# Patient Record
Sex: Female | Born: 1975 | Race: White | Hispanic: No | Marital: Single | Smoking: Current every day smoker
Health system: Southern US, Community
[De-identification: ages and names within clinical notes are randomized; demographics above are authoritative.]

## PROBLEM LIST (undated history)

## (undated) DIAGNOSIS — G4700 Insomnia, unspecified: Secondary | ICD-10-CM

## (undated) DIAGNOSIS — G43909 Migraine, unspecified, not intractable, without status migrainosus: Secondary | ICD-10-CM

## (undated) DIAGNOSIS — M779 Enthesopathy, unspecified: Secondary | ICD-10-CM

## (undated) DIAGNOSIS — M5136 Other intervertebral disc degeneration, lumbar region: Secondary | ICD-10-CM

## (undated) DIAGNOSIS — J329 Chronic sinusitis, unspecified: Secondary | ICD-10-CM

## (undated) DIAGNOSIS — M25569 Pain in unspecified knee: Secondary | ICD-10-CM

## (undated) DIAGNOSIS — M797 Fibromyalgia: Secondary | ICD-10-CM

## (undated) DIAGNOSIS — T148XXA Other injury of unspecified body region, initial encounter: Secondary | ICD-10-CM

## (undated) DIAGNOSIS — K219 Gastro-esophageal reflux disease without esophagitis: Secondary | ICD-10-CM

## (undated) DIAGNOSIS — M51369 Other intervertebral disc degeneration, lumbar region without mention of lumbar back pain or lower extremity pain: Secondary | ICD-10-CM

## (undated) DIAGNOSIS — E663 Overweight: Secondary | ICD-10-CM

## (undated) DIAGNOSIS — G629 Polyneuropathy, unspecified: Secondary | ICD-10-CM

## (undated) DIAGNOSIS — I10 Essential (primary) hypertension: Secondary | ICD-10-CM

## (undated) HISTORY — DX: Enthesopathy, unspecified: M77.9

## (undated) HISTORY — DX: Insomnia, unspecified: G47.00

## (undated) HISTORY — DX: Essential (primary) hypertension: I10

## (undated) HISTORY — DX: Other intervertebral disc degeneration, lumbar region: M51.36

## (undated) HISTORY — DX: Gastro-esophageal reflux disease without esophagitis: K21.9

## (undated) HISTORY — DX: Overweight: E66.3

## (undated) HISTORY — DX: Other injury of unspecified body region, initial encounter: T14.8XXA

## (undated) HISTORY — PX: MOUTH SURGERY: SHX715

## (undated) HISTORY — DX: Polyneuropathy, unspecified: G62.9

## (undated) HISTORY — DX: Chronic sinusitis, unspecified: J32.9

## (undated) HISTORY — DX: Pain in unspecified knee: M25.569

## (undated) HISTORY — DX: Migraine, unspecified, not intractable, without status migrainosus: G43.909

## (undated) HISTORY — DX: Other intervertebral disc degeneration, lumbar region without mention of lumbar back pain or lower extremity pain: M51.369

## (undated) HISTORY — DX: Fibromyalgia: M79.7

---

## 2014-04-02 ENCOUNTER — Encounter: Payer: Self-pay | Admitting: *Deleted

## 2014-04-02 ENCOUNTER — Ambulatory Visit (INDEPENDENT_AMBULATORY_CARE_PROVIDER_SITE_OTHER): Payer: Medicare Other | Admitting: *Deleted

## 2014-04-02 ENCOUNTER — Ambulatory Visit (INDEPENDENT_AMBULATORY_CARE_PROVIDER_SITE_OTHER): Payer: Medicare Other | Admitting: Women's Health

## 2014-04-02 ENCOUNTER — Encounter: Payer: Self-pay | Admitting: Women's Health

## 2014-04-02 VITALS — BP 120/76 | Ht 67.0 in | Wt 243.0 lb

## 2014-04-02 DIAGNOSIS — Z30013 Encounter for initial prescription of injectable contraceptive: Secondary | ICD-10-CM

## 2014-04-02 DIAGNOSIS — J0100 Acute maxillary sinusitis, unspecified: Secondary | ICD-10-CM | POA: Diagnosis not present

## 2014-04-02 DIAGNOSIS — Z3042 Encounter for surveillance of injectable contraceptive: Secondary | ICD-10-CM | POA: Diagnosis not present

## 2014-04-02 DIAGNOSIS — Z3202 Encounter for pregnancy test, result negative: Secondary | ICD-10-CM

## 2014-04-02 LAB — POCT URINE PREGNANCY: PREG TEST UR: NEGATIVE

## 2014-04-02 MED ORDER — MEDROXYPROGESTERONE ACETATE 150 MG/ML IM SUSP: 150.0000 mg | INTRAMUSCULAR | Status: DC

## 2014-04-02 MED ORDER — AMOXICILLIN-POT CLAVULANATE 875-125 MG PO TABS: 1.0000 | ORAL_TABLET | Freq: Two times a day (BID) | ORAL | Status: DC

## 2014-04-02 MED ORDER — MEDROXYPROGESTERONE ACETATE 150 MG/ML IM SUSP
150.0000 mg | Freq: Once | INTRAMUSCULAR | Status: AC
  Administered 2014-04-02: 150 mg via INTRAMUSCULAR

## 2014-04-02 NOTE — Progress Notes (Signed)
Patient ID: Brooke [name], female   DOB: 1975-08-14, 39 y.o.   MRN: [id number]   [facility] Visit  Patient name: Brooke [name] MRN [id number]  Date of birth: Aug 29, 1975  CC & HPI:  Brooke [name] is a 39 y.o. Caucasian female presenting today as a new pt for continuation of depo provera injections. She recently moved here from [place]. Last depo 01/02/14. Has been on x 38yrs, no complications.  Has started process to have Dr. Karie Kirks as PCP, but states she was told she may not be able to see him for first appt til April-May.  Reports Lt sinus pain and swelling x 2d, no fever/chills, or other sx.  Smokes 1.5ppd down from 3ppd. Last pap 2015 in [place] and was normal.   Pertinent History Reviewed:  Medical & Surgical Hx:   Past Medical History  Diagnosis Date  . GERD (gastroesophageal reflux disease)   . Hypertension   . Fibromyalgia   . Neuropathy   . Migraines    History reviewed. No pertinent past surgical history. Medications: Reviewed & Updated - see associated section Social History: Reviewed -  reports that she has been smoking Cigarettes.  She has a 34.5 pack-year smoking history. She has never used smokeless tobacco.  Objective Findings:  Vitals: BP 120/76 mmHg  Ht 5\' 7"  (1.702 m)  Wt 243 lb (110.224 kg)  BMI 38.05 kg/m2  LMP 04/01/2014  Physical Examination: General appearance - alert, well appearing, and in no distress Sinuses: +swelling and mod tenderness/pain to Lt maxillary sinus Neck: no lymphadenopathy Oropharynx: clear w/o exudate Chest - clear to auscultation, no wheezes, rales, symmetric air entry, some rhonchi Heart - normal rate and regular rhythm  No results found for this or any previous visit (from the past 24 hour(s)).   Assessment & Plan:  A:   Contraception management  Acute sinusitis  Smoker P:  Rx depo w/ 3RF, to return today for injection  Rx augmentin 875/125 bid x 7d for sinus infection/smoker   Advised smoking  cessation  Contact Dr. Karie Kirks to establish care/schedule first appt  Tawnya Crook CNM, [facility] 04/02/2014 10:43 AM

## 2014-04-02 NOTE — Progress Notes (Signed)
Pt here for Depo. Reports no problems at this time. Return in 12 weeks for next shot. JSY 

## 2014-04-02 NOTE — Patient Instructions (Signed)

## 2014-06-26 ENCOUNTER — Encounter: Payer: Self-pay | Admitting: *Deleted

## 2014-06-26 ENCOUNTER — Ambulatory Visit (INDEPENDENT_AMBULATORY_CARE_PROVIDER_SITE_OTHER): Payer: Medicare Other | Admitting: *Deleted

## 2014-06-26 DIAGNOSIS — Z3202 Encounter for pregnancy test, result negative: Secondary | ICD-10-CM

## 2014-06-26 DIAGNOSIS — Z3042 Encounter for surveillance of injectable contraceptive: Secondary | ICD-10-CM | POA: Diagnosis not present

## 2014-06-26 LAB — POCT URINE PREGNANCY: PREG TEST UR: NEGATIVE

## 2014-06-26 MED ORDER — MEDROXYPROGESTERONE ACETATE 150 MG/ML IM SUSP
150.0000 mg | Freq: Once | INTRAMUSCULAR | Status: AC
  Administered 2014-06-26: 150 mg via INTRAMUSCULAR

## 2014-06-26 NOTE — Progress Notes (Signed)
Pt here for Depo. Reports no problems at this time. Return in 12 weeks for next shot. JSY 

## 2014-09-18 ENCOUNTER — Encounter: Payer: Self-pay | Admitting: *Deleted

## 2014-09-18 ENCOUNTER — Ambulatory Visit (INDEPENDENT_AMBULATORY_CARE_PROVIDER_SITE_OTHER): Payer: Medicare Other | Admitting: *Deleted

## 2014-09-18 DIAGNOSIS — Z3202 Encounter for pregnancy test, result negative: Secondary | ICD-10-CM | POA: Diagnosis not present

## 2014-09-18 DIAGNOSIS — Z3042 Encounter for surveillance of injectable contraceptive: Secondary | ICD-10-CM | POA: Diagnosis not present

## 2014-09-18 LAB — POCT URINE PREGNANCY: PREG TEST UR: NEGATIVE

## 2014-09-18 MED ORDER — MEDROXYPROGESTERONE ACETATE 150 MG/ML IM SUSP
150.0000 mg | Freq: Once | INTRAMUSCULAR | Status: AC
  Administered 2014-09-18: 150 mg via INTRAMUSCULAR

## 2014-09-18 NOTE — Progress Notes (Signed)
Pt here for Depo. Reports no problems at this time. Return in 12 weeks for next shot. JSY 

## 2014-11-04 ENCOUNTER — Encounter (HOSPITAL_COMMUNITY): Payer: Self-pay | Admitting: Emergency Medicine

## 2014-11-04 ENCOUNTER — Emergency Department (HOSPITAL_COMMUNITY)
Admission: EM | Admit: 2014-11-04 | Discharge: 2014-11-04 | Disposition: A | Discharge status: Home / Self Care | Payer: Medicare Other | Attending: Emergency Medicine | Admitting: Emergency Medicine

## 2014-11-04 DIAGNOSIS — I10 Essential (primary) hypertension: Secondary | ICD-10-CM | POA: Diagnosis not present

## 2014-11-04 DIAGNOSIS — G629 Polyneuropathy, unspecified: Secondary | ICD-10-CM | POA: Diagnosis not present

## 2014-11-04 DIAGNOSIS — R21 Rash and other nonspecific skin eruption: Secondary | ICD-10-CM | POA: Diagnosis present

## 2014-11-04 DIAGNOSIS — R0982 Postnasal drip: Secondary | ICD-10-CM | POA: Insufficient documentation

## 2014-11-04 DIAGNOSIS — K219 Gastro-esophageal reflux disease without esophagitis: Secondary | ICD-10-CM | POA: Diagnosis not present

## 2014-11-04 DIAGNOSIS — M797 Fibromyalgia: Secondary | ICD-10-CM | POA: Diagnosis not present

## 2014-11-04 DIAGNOSIS — Z79899 Other long term (current) drug therapy: Secondary | ICD-10-CM | POA: Insufficient documentation

## 2014-11-04 DIAGNOSIS — Z720 Tobacco use: Secondary | ICD-10-CM | POA: Insufficient documentation

## 2014-11-04 DIAGNOSIS — R0981 Nasal congestion: Secondary | ICD-10-CM | POA: Insufficient documentation

## 2014-11-04 DIAGNOSIS — G43909 Migraine, unspecified, not intractable, without status migrainosus: Secondary | ICD-10-CM | POA: Insufficient documentation

## 2014-11-04 DIAGNOSIS — L309 Dermatitis, unspecified: Secondary | ICD-10-CM | POA: Insufficient documentation

## 2014-11-04 MED ORDER — TRIAMCINOLONE ACETONIDE 0.1 % EX CREA: 1.0000 "application " | TOPICAL_CREAM | Freq: Two times a day (BID) | CUTANEOUS | Status: DC

## 2014-11-04 MED ORDER — DEXAMETHASONE 4 MG PO TABS: 4.0000 mg | ORAL_TABLET | Freq: Two times a day (BID) | ORAL | Status: DC

## 2014-11-04 MED ORDER — DOXYCYCLINE HYCLATE 100 MG PO CAPS: 100.0000 mg | ORAL_CAPSULE | Freq: Two times a day (BID) | ORAL | Status: DC

## 2014-11-04 NOTE — Discharge Instructions (Signed)
Please increase fluids. Please apply triamcinolone to the rash on 2 times daily, and provide a bandage. Use Decadron and doxycycline 2 times daily until all taken. Please return if any signs of advancing infection.

## 2014-11-04 NOTE — ED Provider Notes (Signed)
CSN: [id number]     Arrival date & time 11/04/14  0741 History   First MD Initiated Contact with Patient 11/04/14 [phone]     Chief Complaint  Patient presents with  . Rash    both     (Consider location/radiation/quality/duration/timing/severity/associated sxs/prior Treatment) Patient is a 39 y.o. female presenting with rash. The history is provided by the patient.  Rash Location:  Shoulder/arm Shoulder/arm rash location:  L arm and R arm Quality: dryness, itchiness and scaling   Quality: not draining   Severity:  Moderate Onset quality:  Gradual Duration:  6 days Timing:  Intermittent Progression:  Worsening Chronicity:  New Context: not chemical exposure, not medications, not new detergent/soap and not plant contact   Context comment:  Recent URI Relieved by:  Nothing Ineffective treatments:  OTC analgesics Associated symptoms: headaches and URI   Associated symptoms: no periorbital edema, no shortness of breath, no tongue swelling and not wheezing     Past Medical History  Diagnosis Date  . GERD (gastroesophageal reflux disease)   . Hypertension   . Fibromyalgia   . Neuropathy ([place])   . Migraines    Past Surgical History  Procedure Laterality Date  . Mouth surgery     Family History  Problem Relation Age of Onset  . Hypertension Mother   . Fibromyalgia Mother   . Neuropathy Mother   . Hypertension Father   . Hypertension Sister   . Fibromyalgia Sister   . Neuropathy Sister    Social History  Substance Use Topics  . Smoking status: Current Every Day Smoker -- 1.50 packs/day for 23 years    Types: Cigarettes  . Smokeless tobacco: Never Used  . Alcohol Use: No   OB History    Gravida Para Term Preterm AB TAB SAB Ectopic Multiple Living   3 3 3       3      Review of Systems  HENT: Positive for congestion and postnasal drip.   Respiratory: Negative for shortness of breath and wheezing.   Skin: Positive for rash.  Neurological: Positive for headaches.   All other systems reviewed and are negative.     Allergies  Ciprofloxacin; Other; and Sulfa antibiotics  Home Medications   Prior to Admission medications   Medication Sig Start Date End Date Taking? Authorizing Provider  DULoxetine (CYMBALTA) 60 MG capsule Take 60 mg by mouth daily.    Historical Provider, [place]  esomeprazole (NEXIUM) 40 MG capsule Take 40 mg by mouth daily at 12 noon.    Historical Provider, [place]  Linaclotide Rolan Lipa) 145 MCG CAPS capsule Take 145 mcg by mouth daily.    Historical Provider, [place]  medroxyPROGESTERone (DEPO-PROVERA) 150 MG/ML injection Inject 1 mL (150 mg total) into the muscle every 3 (three) months. 04/02/14   Roma Schanz, CNM  SUMAtriptan (IMITREX) 100 MG tablet Take 100 mg by mouth every 2 (two) hours as needed for migraine. May repeat in 2 hours if headache persists or recurs.    Historical Provider, [place]   BP 140/74 mmHg  Pulse 99  Temp(Src) 97.7 F (36.5 C) (Oral)  Resp 16  Ht 5\' 6"  (1.676 m)  Wt 240 lb (108.863 kg)  BMI 38.76 kg/m2  SpO2 98% Physical Exam  Constitutional: She is oriented to person, place, and time. She appears well-developed and well-nourished.  Non-toxic appearance.  HENT:  Head: Normocephalic.  Right Ear: Tympanic membrane and external ear normal.  Left Ear: Tympanic membrane and external ear normal.  Nasal  congestion present.  Eyes: EOM and lids are normal. Pupils are equal, round, and reactive to light.  Neck: Normal range of motion. Neck supple. Carotid bruit is not present.  Cardiovascular: Normal rate, regular rhythm, normal heart sounds, intact distal pulses and normal pulses.   Pulmonary/Chest: Breath sounds normal. No respiratory distress.  Abdominal: Soft. Bowel sounds are normal. There is no tenderness. There is no guarding.  Musculoskeletal: Normal range of motion.  Lymphadenopathy:       Head (right side): No submandibular adenopathy present.       Head (left side): No submandibular adenopathy present.     She has no cervical adenopathy.  Neurological: She is alert and oriented to person, place, and time. She has normal strength. No cranial nerve deficit or sensory deficit.  Skin: Skin is warm and dry.  A dry scaling rash with some scabbed denuded areas of both forearms extending into the antecubital area. No red streaks appreciated, no drainage noted.  Psychiatric: She has a normal mood and affect. Her speech is normal.  Nursing note and vitals reviewed.   ED Course  Procedures (including critical care time) Labs Review Labs Reviewed - No data to display  Imaging Review No results found. I have personally reviewed and evaluated these images and lab results as part of my medical decision-making.   EKG Interpretation None      MDM  Vital signs are well within normal limits. Pulse oximetry is 98% on room air. Within normal limits by my interpretation. Patient had an upper respiratory infection approximately a week or week and a half ago, she now has a rash on the forearm extending into the into the antecubital. Suspect that the patient has eczema, with some secondary infection. Patient will be treated with Decadron and doxycycline, and triamcinolone cream.    Final diagnoses:  None    *I have reviewed nursing notes, vital signs, and all appropriate lab and imaging results for this patient.Lily Kocher, PA-C 11/04/14 0900  Francine Graven, DO 11/06/14 1455

## 2014-11-04 NOTE — ED Notes (Signed)
Notice rash to both arm about 6 days ago.  Tried several OTC cream with no relief.  Rates pain 5/10, tender to touch.

## 2014-12-11 ENCOUNTER — Encounter: Payer: Self-pay | Admitting: *Deleted

## 2014-12-11 ENCOUNTER — Ambulatory Visit (INDEPENDENT_AMBULATORY_CARE_PROVIDER_SITE_OTHER): Payer: Medicare Other | Admitting: *Deleted

## 2014-12-11 DIAGNOSIS — Z3042 Encounter for surveillance of injectable contraceptive: Secondary | ICD-10-CM | POA: Diagnosis not present

## 2014-12-11 DIAGNOSIS — Z3202 Encounter for pregnancy test, result negative: Secondary | ICD-10-CM

## 2014-12-11 LAB — POCT URINE PREGNANCY: PREG TEST UR: NEGATIVE

## 2014-12-11 MED ORDER — MEDROXYPROGESTERONE ACETATE 150 MG/ML IM SUSP
150.0000 mg | Freq: Once | INTRAMUSCULAR | Status: AC
  Administered 2014-12-11: 150 mg via INTRAMUSCULAR

## 2014-12-11 NOTE — Progress Notes (Signed)
Pt here for Depo. Reports no problems at this time. Return in 12 weeks for next shot. JSY 

## 2015-02-18 ENCOUNTER — Other Ambulatory Visit: Payer: Self-pay | Admitting: Women's Health

## 2015-03-05 ENCOUNTER — Ambulatory Visit (INDEPENDENT_AMBULATORY_CARE_PROVIDER_SITE_OTHER): Payer: Medicare Other | Admitting: *Deleted

## 2015-03-05 ENCOUNTER — Encounter: Payer: Self-pay | Admitting: *Deleted

## 2015-03-05 DIAGNOSIS — Z3202 Encounter for pregnancy test, result negative: Secondary | ICD-10-CM

## 2015-03-05 DIAGNOSIS — Z3042 Encounter for surveillance of injectable contraceptive: Secondary | ICD-10-CM | POA: Diagnosis not present

## 2015-03-05 LAB — POCT URINE PREGNANCY: Preg Test, Ur: NEGATIVE

## 2015-03-05 MED ORDER — MEDROXYPROGESTERONE ACETATE 150 MG/ML IM SUSP
150.0000 mg | Freq: Once | INTRAMUSCULAR | Status: AC
  Administered 2015-03-05: 150 mg via INTRAMUSCULAR

## 2015-03-05 NOTE — Progress Notes (Signed)
Pt here for Depo. Pt tolerated shot well. Return in 12 weeks for next shot. JSY 

## 2015-03-06 ENCOUNTER — Ambulatory Visit (INDEPENDENT_AMBULATORY_CARE_PROVIDER_SITE_OTHER): Payer: Medicare Other | Admitting: Orthopaedic Surgery

## 2015-03-06 ENCOUNTER — Ambulatory Visit (INDEPENDENT_AMBULATORY_CARE_PROVIDER_SITE_OTHER): Payer: Medicare Other

## 2015-03-06 VITALS — BP 142/95 | HR 92 | Temp 97.7°F | Ht 66.0 in | Wt 249.0 lb

## 2015-03-06 DIAGNOSIS — M542 Cervicalgia: Secondary | ICD-10-CM | POA: Diagnosis not present

## 2015-03-06 NOTE — Patient Instructions (Signed)
Call [facility] to schedule therapy appointments
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
## 2015-03-27 ENCOUNTER — Ambulatory Visit (INDEPENDENT_AMBULATORY_CARE_PROVIDER_SITE_OTHER): Payer: Medicare Other | Admitting: Orthopaedic Surgery

## 2015-03-27 ENCOUNTER — Encounter: Payer: Self-pay | Admitting: Orthopaedic Surgery

## 2015-03-27 VITALS — BP 135/88 | HR 108 | Temp 98.1°F | Resp 16 | Ht 66.0 in | Wt 250.0 lb

## 2015-03-27 DIAGNOSIS — M542 Cervicalgia: Secondary | ICD-10-CM

## 2015-03-27 NOTE — Patient Instructions (Signed)
Smoking Cessation, Tips for Success If you are ready to quit smoking, congratulations! You have chosen to help yourself be healthier. Cigarettes bring nicotine, tar, carbon monoxide, and other irritants into your body. Your lungs, heart, and blood vessels will be able to work better without these poisons. There are many different ways to quit smoking. Nicotine gum, nicotine patches, a nicotine inhaler, or nicotine nasal spray can help with physical craving. Hypnosis, support groups, and medicines help break the habit of smoking. WHAT THINGS CAN I DO TO MAKE QUITTING EASIER?  Here are some tips to help you quit for good:  Pick a date when you will quit smoking completely. Tell all of your friends and family about your plan to quit on that date.  Do not try to slowly cut down on the number of cigarettes you are smoking. Pick a quit date and quit smoking completely starting on that day.  Throw away all cigarettes.   Clean and remove all ashtrays from your home, work, and car.  On a card, write down your reasons for quitting. Carry the card with you and read it when you get the urge to smoke.  Cleanse your body of nicotine. Drink enough water and fluids to keep your urine clear or pale yellow. Do this after quitting to flush the nicotine from your body.  Learn to predict your moods. Do not let a bad situation be your excuse to have a cigarette. Some situations in your life might tempt you into wanting a cigarette.  Never have "just one" cigarette. It leads to wanting another and another. Remind yourself of your decision to quit.  Change habits associated with smoking. If you smoked while driving or when feeling stressed, try other activities to replace smoking. Stand up when drinking your coffee. Brush your teeth after eating. Sit in a different chair when you read the paper. Avoid alcohol while trying to quit, and try to drink fewer caffeinated beverages. Alcohol and caffeine may urge you to  smoke.  Avoid foods and drinks that can trigger a desire to smoke, such as sugary or spicy foods and alcohol.  Ask people who smoke not to smoke around you.  Have something planned to do right after eating or having a cup of coffee. For example, plan to take a walk or exercise.  Try a relaxation exercise to calm you down and decrease your stress. Remember, you may be tense and nervous for the first 2 weeks after you quit, but this will pass.  Find new activities to keep your hands busy. Play with a pen, coin, or rubber band. Doodle or draw things on paper.  Brush your teeth right after eating. This will help cut down on the craving for the taste of tobacco after meals. You can also try mouthwash.   Use oral substitutes in place of cigarettes. Try using lemon drops, carrots, cinnamon sticks, or chewing gum. Keep them handy so they are available when you have the urge to smoke.  When you have the urge to smoke, try deep breathing.  Designate your home as a nonsmoking area.  If you are a heavy smoker, ask your health care provider about a prescription for nicotine chewing gum. It can ease your withdrawal from nicotine.  Reward yourself. Set aside the cigarette money you save and buy yourself something nice.  Look for support from others. Join a support group or smoking cessation program. Ask someone at home or at work to help you with your plan   to quit smoking.  Always ask yourself, "Do I need this cigarette or is this just a reflex?" Tell yourself, "Today, I choose not to smoke," or "I do not want to smoke." You are reminding yourself of your decision to quit.  Do not replace cigarette smoking with electronic cigarettes (commonly called e-cigarettes). The safety of e-cigarettes is unknown, and some may contain harmful chemicals.  If you relapse, do not give up! Plan ahead and think about what you will do the next time you get the urge to smoke. HOW WILL I FEEL WHEN I QUIT SMOKING? You  may have symptoms of withdrawal because your body is used to nicotine (the addictive substance in cigarettes). You may crave cigarettes, be irritable, feel very hungry, cough often, get headaches, or have difficulty concentrating. The withdrawal symptoms are only temporary. They are strongest when you first quit but will go away within 10-14 days. When withdrawal symptoms occur, stay in control. Think about your reasons for quitting. Remind yourself that these are signs that your body is healing and getting used to being without cigarettes. Remember that withdrawal symptoms are easier to treat than the major diseases that smoking can cause.  Even after the withdrawal is over, expect periodic urges to smoke. However, these cravings are generally short lived and will go away whether you smoke or not. Do not smoke! WHAT RESOURCES ARE AVAILABLE TO HELP ME QUIT SMOKING? Your health care provider can direct you to community resources or hospitals for support, which may include:  Group support.  Education.  Hypnosis.  Therapy.   This information is not intended to replace advice given to you by your health care provider. Make sure you discuss any questions you have with your health care provider.   Document Released: 09/19/2003 Document Revised: 01/11/2014 Document Reviewed: 06/08/2012 Elsevier Interactive Patient Education 2016 [employer] Inc.  

## 2015-03-27 NOTE — Progress Notes (Signed)
Patient Brooke [name]:[id number] Leh, female DOB:01-30-1975, 40 y.o. Brooke [name]:[id number]  Chief Complaint  Patient presents with  . Follow-up    follow up neck pain "better"    HPI  Brooke [name] is a 40 y.o. female who is seen for follow-up on her neck pain. She has taken the medicine and had done her exercises. She has no pain now.  She has no paresthesias.  She is doing very well.  I went over some precautions with her for future knowledge.  HPI  Body mass index is 40.37 kg/(m^2).  Review of Systems  Constitutional:       Patient does not have diabetes. Patient has hypertension Patient does not have COPD Patient is current smoker.  HENT: Negative for congestion.   Respiratory: Negative for shortness of breath.   Cardiovascular: Negative for chest pain.  Endocrine: Positive for cold intolerance.  Musculoskeletal: Positive for neck pain.    Past Medical History  Diagnosis Date  . GERD (gastroesophageal reflux disease)   . Hypertension   . Fibromyalgia   . Neuropathy ([place])   . Migraines     Past Surgical History  Procedure Laterality Date  . Mouth surgery      Family History  Problem Relation Age of Onset  . Hypertension Mother   . Fibromyalgia Mother   . Neuropathy Mother   . Hypertension Father   . Hypertension Sister   . Fibromyalgia Sister   . Neuropathy Sister     Social History Social History  Substance Use Topics  . Smoking status: Current Every Day Smoker -- 1.50 packs/day for 23 years    Types: Cigarettes  . Smokeless tobacco: Never Used  . Alcohol Use: No    Allergies  Allergen Reactions  . Ciprofloxacin   . Other     Z-pack  . Sulfa Antibiotics Swelling    Current Outpatient Prescriptions  Medication Sig Dispense Refill  . DULoxetine (CYMBALTA) 60 MG capsule Take 60 mg by mouth daily.    Marland Kitchen esomeprazole (NEXIUM) 40 MG capsule Take 40 mg by mouth daily at 12 noon.    . gabapentin (NEURONTIN) 600 MG tablet Take 600 mg by mouth 3 (three) times  daily.    . Linaclotide (LINZESS) 145 MCG CAPS capsule Take 145 mcg by mouth daily.    . medroxyPROGESTERone (DEPO-PROVERA) 150 MG/ML injection ADMINISTER 1 ML(150 MG) IN THE MUSCLE EVERY 3 MONTHS 1 mL 0  . Oxycodone HCl 10 MG TABS Take by mouth 2 (two) times daily as needed.    . SUMAtriptan (IMITREX) 100 MG tablet Take 100 mg by mouth every 2 (two) hours as needed for migraine. May repeat in 2 hours if headache persists or recurs.     No current facility-administered medications for this visit.     Physical Exam  Blood pressure 135/88, pulse 108, temperature 98.1 F (36.7 C), resp. rate 16, height 5\' 6"  (1.676 m), weight 250 lb (113.399 kg).  Constitutional: overall normal hygiene, normal nutrition, well developed, normal grooming, normal body habitus. Assistive device:none  Musculoskeletal: gait and station Limp none, muscle tone and strength are normal, no tremors or atrophy is present.  .  Neurological: coordination overall normal.  Deep tendon reflex/nerve stretch intact.  Sensation normal.  Cranial nerves II-XII intact.   Skin:   normal overall no scars, lesions, ulcers or rashes. No psoriasis.  Psychiatric: Alert and oriented x 3.  Recent memory intact, remote memory unclear.  Normal mood and affect. Well groomed.  Good eye contact.  Cardiovascular:  overall no swelling, no varicosities, no edema bilaterally, normal temperatures of the legs and arms, no clubbing, cyanosis and good capillary refill.  Lymphatic: palpation is normal.  She has full ROM of the neck today.  She has no spasm.  She has full ROM of the shoulders.  She is doing well.  The patient has been educated about the nature of the problem(s) and counseled on treatment options.  The patient appeared to understand what I have discussed and is in agreement with it.  Encounter Diagnosis  Name Primary?  . Neck pain Yes    PLAN Call if any problems.  Precautions discussed.  Continue current medications.    Return to clinic PRN

## 2015-04-15 ENCOUNTER — Other Ambulatory Visit: Payer: Medicare Other | Admitting: Women's Health

## 2015-04-23 ENCOUNTER — Other Ambulatory Visit: Payer: Medicare Other | Admitting: Women's Health

## 2015-04-28 ENCOUNTER — Other Ambulatory Visit: Payer: Medicare Other | Admitting: Women's Health

## 2015-04-28 ENCOUNTER — Encounter: Payer: Self-pay | Admitting: Women's Health

## 2015-05-05 ENCOUNTER — Ambulatory Visit (INDEPENDENT_AMBULATORY_CARE_PROVIDER_SITE_OTHER): Payer: Medicare Other | Admitting: Women's Health

## 2015-05-05 ENCOUNTER — Encounter: Payer: Self-pay | Admitting: Women's Health

## 2015-05-05 ENCOUNTER — Other Ambulatory Visit (HOSPITAL_COMMUNITY)
Admission: RE | Admit: 2015-05-05 | Discharge: 2015-05-05 | Disposition: A | Discharge status: Home / Self Care | Payer: Medicare Other | Source: Ambulatory Visit | Attending: Obstetrics & Gynecology | Admitting: Obstetrics & Gynecology

## 2015-05-05 VITALS — BP 142/86 | HR 68 | Ht 65.5 in | Wt 249.0 lb

## 2015-05-05 DIAGNOSIS — Z01411 Encounter for gynecological examination (general) (routine) with abnormal findings: Secondary | ICD-10-CM

## 2015-05-05 DIAGNOSIS — E669 Obesity, unspecified: Secondary | ICD-10-CM

## 2015-05-05 DIAGNOSIS — R030 Elevated blood-pressure reading, without diagnosis of hypertension: Secondary | ICD-10-CM | POA: Diagnosis not present

## 2015-05-05 DIAGNOSIS — Z01419 Encounter for gynecological examination (general) (routine) without abnormal findings: Secondary | ICD-10-CM

## 2015-05-05 DIAGNOSIS — IMO0001 Reserved for inherently not codable concepts without codable children: Secondary | ICD-10-CM

## 2015-05-05 DIAGNOSIS — Z113 Encounter for screening for infections with a predominantly sexual mode of transmission: Secondary | ICD-10-CM | POA: Diagnosis present

## 2015-05-05 DIAGNOSIS — Z1151 Encounter for screening for human papillomavirus (HPV): Secondary | ICD-10-CM | POA: Diagnosis present

## 2015-05-05 DIAGNOSIS — Z720 Tobacco use: Secondary | ICD-10-CM | POA: Diagnosis not present

## 2015-05-05 DIAGNOSIS — F17200 Nicotine dependence, unspecified, uncomplicated: Secondary | ICD-10-CM | POA: Insufficient documentation

## 2015-05-05 MED ORDER — MEDROXYPROGESTERONE ACETATE 150 MG/ML IM SUSP: INTRAMUSCULAR | Status: DC

## 2015-05-05 NOTE — Progress Notes (Signed)
Patient ID: Brooke [name], female   DOB: 03-24-1975, 40 y.o.   MRN: [id number] Subjective:   Brooke [name] is a 40 y.o. G86P3003 Caucasian female here for a routine well-woman exam.  No LMP recorded. Patient has had an injection.    Current complaints: none PCP: Dr. Marlou Sa at [facility], sees once/mth d/t chronic opiates d/t neuropathy/fibromyalgia/chronic LBP- his office is closing and plans to follow him to [place]       Does not desire labs, all done by PCP Reports bp's have been up & down, Dr. Marlou Sa is keeping an eye on it, no h/o taking meds Has lost 10lbs in last 30mths from being more active, out in yard playing w/ kids, etc- eats a lot of white bread Needs depo refilled  Social History: Sexual: heterosexual Marital Status: engaged Living situation: fiance and her 2 girls Occupation: unemployed Tobacco/alcohol: tobacco 1pk/day down from 4pk/day, etoh: none Illicit drugs: no history of illicit drug use  The following portions of the patient's history were reviewed and updated as appropriate: allergies, current medications, past family history, past medical history, past social history, past surgical history and problem list.  Past Medical History Past Medical History  Diagnosis Date  . GERD (gastroesophageal reflux disease)   . Hypertension   . Fibromyalgia   . Neuropathy ([place])   . Migraines     Past Surgical History Past Surgical History  Procedure Laterality Date  . Mouth surgery      Gynecologic History G3P3003  No LMP recorded. Patient has had an injection. Contraception: Depo-Provera injections Last Pap: 2014 in [place]. Results were: normal Last mammogram: never. Results were: n/a Last TCS: never  Obstetric History OB History  Gravida Para Term Preterm AB SAB TAB Ectopic Multiple Living  3 3 3       3     # Outcome Date GA Lbr Len/2nd Weight Sex Delivery Anes PTL Lv  3 Term 05/19/07 [redacted]w[redacted]d  7 lb 10 oz (3.459 kg) F Vag-Spont   Y  2 Term  09/26/05 [redacted]w[redacted]d  6 lb 13 oz (3.09 kg) F Vag-Spont   Y  1 Term 12/26/96 [redacted]w[redacted]d  6 lb 9 oz (2.977 kg) M Vag-Spont   Y      Current Medications Current Outpatient Prescriptions on File Prior to Visit  Medication Sig Dispense Refill  . DULoxetine (CYMBALTA) 60 MG capsule Take 60 mg by mouth daily.    Marland Kitchen esomeprazole (NEXIUM) 40 MG capsule Take 40 mg by mouth daily at 12 noon.    . gabapentin (NEURONTIN) 600 MG tablet Take 600 mg by mouth 3 (three) times daily.    . Linaclotide (LINZESS) 145 MCG CAPS capsule Take 145 mcg by mouth daily.    . Oxycodone HCl 10 MG TABS Take by mouth 2 (two) times daily as needed.    . SUMAtriptan (IMITREX) 100 MG tablet Take 100 mg by mouth every 2 (two) hours as needed for migraine. May repeat in 2 hours if headache persists or recurs.     No current facility-administered medications on file prior to visit.    Review of Systems Patient denies any headaches, blurred vision, shortness of breath, chest pain, abdominal pain, problems with bowel movements, urination, or intercourse.  Objective:  BP 142/86 mmHg  Pulse 68  Ht 5' 5.5" (1.664 m)  Wt 249 lb (112.946 kg)  BMI 40.79 kg/m2 Physical Exam  General:  Well developed, well nourished, no acute distress. She is alert and oriented x3. Skin:  Warm and dry Neck:  Midline trachea, no thyromegaly or nodules Cardiovascular: Regular rate and rhythm, no murmur heard Lungs:  Effort normal, all lung fields clear to auscultation bilaterally Breasts:  No dominant palpable mass, retraction, or nipple discharge Abdomen:  Soft, non tender, no hepatosplenomegaly or masses Pelvic:  External genitalia is normal in appearance.  The vagina is normal in appearance. The cervix is bulbous, no CMT, friable w/ pap. Thin prep pap is done w/ HR HPV cotesting. Uterus is felt to be normal size, shape, and contour.  No adnexal masses or tenderness noted. Extremities:  No swelling or varicosities noted Psych:  She has a normal mood and  affect  Assessment:   Healthy well-woman exam Slightly elevated bp, PCP monitoring Obesity Smoker Contraception management  Plan:  Advised continued weight loss, decrease carbs, switch from white bread to wheat then whole grain, increase exercise Advised complete smoking cessation, gave printed tips PCP to monitor bp, sees him once/mth Refilled depo w/ 3RF F/U as scheduled for next depo, or sooner if needed Mammogram @40yo  or sooner if problems Colonoscopy @40yo  or sooner if problems  Tawnya Crook CNM, [facility] 05/05/2015 10:47 AM

## 2015-05-05 NOTE — Patient Instructions (Addendum)
Medroxyprogesterone injection [Contraceptive] What is this medicine? MEDROXYPROGESTERONE (me DROX ee proe JES te rone) contraceptive injections prevent pregnancy. They provide effective birth control for 3 months. Depo-subQ Provera 104 is also used for treating pain related to endometriosis. This medicine may be used for other purposes; ask your health care provider or pharmacist if you have questions. What should I tell my health care provider before I take this medicine? They need to know if you have any of these conditions: -frequently drink alcohol -asthma -blood vessel disease or a history of a blood clot in the lungs or legs -bone disease such as osteoporosis -breast cancer -diabetes -eating disorder (anorexia nervosa or bulimia) -high blood pressure -HIV infection or AIDS -kidney disease -liver disease -mental depression -migraine -seizures (convulsions) -stroke -tobacco smoker -vaginal bleeding -an unusual or allergic reaction to medroxyprogesterone, other hormones, medicines, foods, dyes, or preservatives -pregnant or trying to get pregnant -breast-feeding How should I use this medicine? Depo-Provera Contraceptive injection is given into a muscle. Depo-subQ Provera 104 injection is given under the skin. These injections are given by a health care professional. You must not be pregnant before getting an injection. The injection is usually given during the first 5 days after the start of a menstrual period or 6 weeks after delivery of a baby. Talk to your pediatrician regarding the use of this medicine in children. Special care may be needed. These injections have been used in female children who have started having menstrual periods. Overdosage: If you think you have taken too much of this medicine contact a poison control center or emergency room at once. NOTE: This medicine is only for you. Do not share this medicine with others. What if I miss a dose? Try not to miss a  dose. You must get an injection once every 3 months to maintain birth control. If you cannot keep an appointment, call and reschedule it. If you wait longer than 13 weeks between Depo-Provera contraceptive injections or longer than 14 weeks between Depo-subQ Provera 104 injections, you could get pregnant. Use another method for birth control if you miss your appointment. You may also need a pregnancy test before receiving another injection. What may interact with this medicine? Do not take this medicine with any of the following medications: -bosentan This medicine may also interact with the following medications: -aminoglutethimide -antibiotics or medicines for infections, especially rifampin, rifabutin, rifapentine, and griseofulvin -aprepitant -barbiturate medicines such as phenobarbital or primidone -bexarotene -carbamazepine -medicines for seizures like ethotoin, felbamate, oxcarbazepine, phenytoin, topiramate -modafinil -St. John's wort This list may not describe all possible interactions. Give your health care provider a list of all the medicines, herbs, non-prescription drugs, or dietary supplements you use. Also tell them if you smoke, drink alcohol, or use illegal drugs. Some items may interact with your medicine. What should I watch for while using this medicine? This drug does not protect you against HIV infection (AIDS) or other sexually transmitted diseases. Use of this product may cause you to lose calcium from your bones. Loss of calcium may cause weak bones (osteoporosis). Only use this product for more than 2 years if other forms of birth control are not right for you. The longer you use this product for birth control the more likely you will be at risk for weak bones. Ask your health care professional how you can keep strong bones. You may have a change in bleeding pattern or irregular periods. Many females stop having periods while taking this drug. If you have  received your  injections on time, your chance of being pregnant is very low. If you think you may be pregnant, see your health care professional as soon as possible. Tell your health care professional if you want to get pregnant within the next year. The effect of this medicine may last a long time after you get your last injection. What side effects may I notice from receiving this medicine? Side effects that you should report to your doctor or health care professional as soon as possible: -allergic reactions like skin rash, itching or hives, swelling of the face, lips, or tongue -breast tenderness or discharge -breathing problems -changes in vision -depression -feeling faint or lightheaded, falls -fever -pain in the abdomen, chest, groin, or leg -problems with balance, talking, walking -unusually weak or tired -yellowing of the eyes or skin Side effects that usually do not require medical attention (report to your doctor or health care professional if they continue or are bothersome): -acne -fluid retention and swelling -headache -irregular periods, spotting, or absent periods -temporary pain, itching, or skin reaction at site where injected -weight gain This list may not describe all possible side effects. Call your doctor for medical advice about side effects. You may report side effects to FDA at 1-800-FDA-1088. Where should I keep my medicine? This does not apply. The injection will be given to you by a health care professional. NOTE: This sheet is a summary. It may not cover all possible information. If you have questions about this medicine, talk to your doctor, pharmacist, or health care provider.    2016, Elsevier/Gold Standard. (2008-01-12 18:37:56)  Smoking Cessation, Tips for Success If you are ready to quit smoking, congratulations! You have chosen to help yourself be healthier. Cigarettes bring nicotine, tar, carbon monoxide, and other irritants into your body. Your lungs, heart, and  blood vessels will be able to work better without these poisons. There are many different ways to quit smoking. Nicotine gum, nicotine patches, a nicotine inhaler, or nicotine nasal spray can help with physical craving. Hypnosis, support groups, and medicines help break the habit of smoking. WHAT THINGS CAN I DO TO MAKE QUITTING EASIER?  Here are some tips to help you quit for good:  Pick a date when you will quit smoking completely. Tell all of your friends and family about your plan to quit on that date.  Do not try to slowly cut down on the number of cigarettes you are smoking. Pick a quit date and quit smoking completely starting on that day.  Throw away all cigarettes.   Clean and remove all ashtrays from your home, work, and car.  On a card, write down your reasons for quitting. Carry the card with you and read it when you get the urge to smoke.  Cleanse your body of nicotine. Drink enough water and fluids to keep your urine clear or pale yellow. Do this after quitting to flush the nicotine from your body.  Learn to predict your moods. Do not let a bad situation be your excuse to have a cigarette. Some situations in your life might tempt you into wanting a cigarette.  Never have "just one" cigarette. It leads to wanting another and another. Remind yourself of your decision to quit.  Change habits associated with smoking. If you smoked while driving or when feeling stressed, try other activities to replace smoking. Stand up when drinking your coffee. Brush your teeth after eating. Sit in a different chair when you read the paper. Avoid  alcohol while trying to quit, and try to drink fewer caffeinated beverages. Alcohol and caffeine may urge you to smoke.  Avoid foods and drinks that can trigger a desire to smoke, such as sugary or spicy foods and alcohol.  Ask people who smoke not to smoke around you.  Have something planned to do right after eating or having a cup of coffee. For  example, plan to take a walk or exercise.  Try a relaxation exercise to calm you down and decrease your stress. Remember, you may be tense and nervous for the first 2 weeks after you quit, but this will pass.  Find new activities to keep your hands busy. Play with a pen, coin, or rubber band. Doodle or draw things on paper.  Brush your teeth right after eating. This will help cut down on the craving for the taste of tobacco after meals. You can also try mouthwash.   Use oral substitutes in place of cigarettes. Try using lemon drops, carrots, cinnamon sticks, or chewing gum. Keep them handy so they are available when you have the urge to smoke.  When you have the urge to smoke, try deep breathing.  Designate your home as a nonsmoking area.  If you are a heavy smoker, ask your health care provider about a prescription for nicotine chewing gum. It can ease your withdrawal from nicotine.  Reward yourself. Set aside the cigarette money you save and buy yourself something nice.  Look for support from others. Join a support group or smoking cessation program. Ask someone at home or at work to help you with your plan to quit smoking.  Always ask yourself, "Do I need this cigarette or is this just a reflex?" Tell yourself, "Today, I choose not to smoke," or "I do not want to smoke." You are reminding yourself of your decision to quit.  Do not replace cigarette smoking with electronic cigarettes (commonly called e-cigarettes). The safety of e-cigarettes is unknown, and some may contain harmful chemicals.  If you relapse, do not give up! Plan ahead and think about what you will do the next time you get the urge to smoke. HOW WILL I FEEL WHEN I QUIT SMOKING? You may have symptoms of withdrawal because your body is used to nicotine (the addictive substance in cigarettes). You may crave cigarettes, be irritable, feel very hungry, cough often, get headaches, or have difficulty concentrating. The  withdrawal symptoms are only temporary. They are strongest when you first quit but will go away within 10-14 days. When withdrawal symptoms occur, stay in control. Think about your reasons for quitting. Remind yourself that these are signs that your body is healing and getting used to being without cigarettes. Remember that withdrawal symptoms are easier to treat than the major diseases that smoking can cause.  Even after the withdrawal is over, expect periodic urges to smoke. However, these cravings are generally short lived and will go away whether you smoke or not. Do not smoke! WHAT RESOURCES ARE AVAILABLE TO HELP ME QUIT SMOKING? Your health care provider can direct you to community resources or hospitals for support, which may include:  Group support.  Education.  Hypnosis.  Therapy.   This information is not intended to replace advice given to you by your health care provider. Make sure you discuss any questions you have with your health care provider.   Document Released: 09/19/2003 Document Revised: 01/11/2014 Document Reviewed: 06/08/2012 Elsevier Interactive Patient Education [employer].

## 2015-05-07 LAB — CYTOLOGY - PAP

## 2015-05-28 ENCOUNTER — Encounter: Payer: Self-pay | Admitting: *Deleted

## 2015-05-28 ENCOUNTER — Ambulatory Visit (INDEPENDENT_AMBULATORY_CARE_PROVIDER_SITE_OTHER): Payer: Medicare Other | Admitting: *Deleted

## 2015-05-28 DIAGNOSIS — Z3042 Encounter for surveillance of injectable contraceptive: Secondary | ICD-10-CM | POA: Diagnosis not present

## 2015-05-28 DIAGNOSIS — Z3202 Encounter for pregnancy test, result negative: Secondary | ICD-10-CM

## 2015-05-28 LAB — POCT URINE PREGNANCY: Preg Test, Ur: NEGATIVE

## 2015-05-28 MED ORDER — MEDROXYPROGESTERONE ACETATE 150 MG/ML IM SUSP
150.0000 mg | Freq: Once | INTRAMUSCULAR | Status: AC
  Administered 2015-05-28: 150 mg via INTRAMUSCULAR

## 2015-05-28 NOTE — Progress Notes (Signed)
Pt here for Depo. Pt tolerated shot well. Return in 12 weeks for next shot. JSY 

## 2015-08-20 ENCOUNTER — Ambulatory Visit (INDEPENDENT_AMBULATORY_CARE_PROVIDER_SITE_OTHER): Payer: Medicare Other

## 2015-08-20 DIAGNOSIS — Z3042 Encounter for surveillance of injectable contraceptive: Secondary | ICD-10-CM | POA: Diagnosis not present

## 2015-08-20 DIAGNOSIS — Z3202 Encounter for pregnancy test, result negative: Secondary | ICD-10-CM | POA: Diagnosis not present

## 2015-08-20 LAB — POCT URINE PREGNANCY: PREG TEST UR: NEGATIVE

## 2015-08-20 MED ORDER — MEDROXYPROGESTERONE ACETATE 150 MG/ML IM SUSP
150.0000 mg | Freq: Once | INTRAMUSCULAR | Status: AC
  Administered 2015-08-20: 150 mg via INTRAMUSCULAR

## 2015-08-20 NOTE — Progress Notes (Signed)
Pt received Depo shot IM Lt Deltoid. Tolerated well. Return in 12 week for next shot.Raelea Gosse CmA.

## 2015-10-03 ENCOUNTER — Other Ambulatory Visit (HOSPITAL_COMMUNITY): Payer: Self-pay | Admitting: Internal Medicine

## 2015-10-03 DIAGNOSIS — Z780 Asymptomatic menopausal state: Secondary | ICD-10-CM

## 2015-10-10 ENCOUNTER — Other Ambulatory Visit (HOSPITAL_COMMUNITY): Payer: Medicare Other

## 2015-10-10 ENCOUNTER — Other Ambulatory Visit (HOSPITAL_COMMUNITY): Payer: Self-pay | Admitting: Internal Medicine

## 2015-10-17 ENCOUNTER — Other Ambulatory Visit (HOSPITAL_COMMUNITY): Payer: Medicare Other

## 2015-10-20 ENCOUNTER — Other Ambulatory Visit (HOSPITAL_COMMUNITY): Payer: Self-pay | Admitting: Internal Medicine

## 2015-10-20 DIAGNOSIS — Z79899 Other long term (current) drug therapy: Secondary | ICD-10-CM

## 2015-10-27 ENCOUNTER — Inpatient Hospital Stay (HOSPITAL_COMMUNITY): Admission: RE | Admit: 2015-10-27 | Payer: Medicare Other | Source: Ambulatory Visit

## 2015-11-12 ENCOUNTER — Encounter: Payer: Self-pay | Admitting: *Deleted

## 2015-11-12 ENCOUNTER — Ambulatory Visit (INDEPENDENT_AMBULATORY_CARE_PROVIDER_SITE_OTHER): Payer: Medicare Other | Admitting: *Deleted

## 2015-11-12 DIAGNOSIS — Z3202 Encounter for pregnancy test, result negative: Secondary | ICD-10-CM

## 2015-11-12 DIAGNOSIS — Z308 Encounter for other contraceptive management: Secondary | ICD-10-CM | POA: Diagnosis not present

## 2015-11-12 LAB — POCT URINE PREGNANCY: PREG TEST UR: NEGATIVE

## 2015-11-12 MED ORDER — MEDROXYPROGESTERONE ACETATE 150 MG/ML IM SUSP
150.0000 mg | Freq: Once | INTRAMUSCULAR | Status: AC
  Administered 2015-11-12: 150 mg via INTRAMUSCULAR

## 2015-11-12 NOTE — Progress Notes (Signed)
Pt here for Depo. Pt tolerated shot well. Return in 12 weeks for next shot. JSY 

## 2016-02-12 ENCOUNTER — Ambulatory Visit (INDEPENDENT_AMBULATORY_CARE_PROVIDER_SITE_OTHER): Payer: Medicare Other | Admitting: *Deleted

## 2016-02-12 ENCOUNTER — Encounter: Payer: Self-pay | Admitting: *Deleted

## 2016-02-12 DIAGNOSIS — Z3042 Encounter for surveillance of injectable contraceptive: Secondary | ICD-10-CM

## 2016-02-12 DIAGNOSIS — Z3202 Encounter for pregnancy test, result negative: Secondary | ICD-10-CM | POA: Diagnosis not present

## 2016-02-12 DIAGNOSIS — Z308 Encounter for other contraceptive management: Secondary | ICD-10-CM

## 2016-02-12 LAB — POCT URINE PREGNANCY: PREG TEST UR: NEGATIVE

## 2016-02-12 MED ORDER — MEDROXYPROGESTERONE ACETATE 150 MG/ML IM SUSP
150.0000 mg | Freq: Once | INTRAMUSCULAR | Status: AC
  Administered 2016-02-12: 150 mg via INTRAMUSCULAR

## 2016-02-12 NOTE — Progress Notes (Signed)
Pt here for Depo. Pt is 1 day late getting shot. Last time she had sex was 1 week ago. Pt tolerated shot well. Return in 12 weeks for next shot.Brooke [name]

## 2016-02-26 ENCOUNTER — Emergency Department (HOSPITAL_COMMUNITY): Payer: Medicare Other

## 2016-02-26 ENCOUNTER — Emergency Department (HOSPITAL_COMMUNITY)
Admission: EM | Admit: 2016-02-26 | Discharge: 2016-02-27 | Disposition: A | Discharge status: Home / Self Care | Payer: Medicare Other | Attending: Emergency Medicine | Admitting: Emergency Medicine

## 2016-02-26 ENCOUNTER — Encounter (HOSPITAL_COMMUNITY): Payer: Self-pay | Admitting: *Deleted

## 2016-02-26 DIAGNOSIS — N201 Calculus of ureter: Secondary | ICD-10-CM | POA: Diagnosis not present

## 2016-02-26 DIAGNOSIS — I10 Essential (primary) hypertension: Secondary | ICD-10-CM | POA: Insufficient documentation

## 2016-02-26 DIAGNOSIS — N390 Urinary tract infection, site not specified: Secondary | ICD-10-CM | POA: Diagnosis not present

## 2016-02-26 DIAGNOSIS — F17210 Nicotine dependence, cigarettes, uncomplicated: Secondary | ICD-10-CM | POA: Diagnosis not present

## 2016-02-26 DIAGNOSIS — Z79899 Other long term (current) drug therapy: Secondary | ICD-10-CM | POA: Diagnosis not present

## 2016-02-26 DIAGNOSIS — R319 Hematuria, unspecified: Secondary | ICD-10-CM

## 2016-02-26 DIAGNOSIS — R1030 Lower abdominal pain, unspecified: Secondary | ICD-10-CM | POA: Diagnosis present

## 2016-02-26 MED ORDER — KETOROLAC TROMETHAMINE 30 MG/ML IJ SOLN
30.0000 mg | Freq: Once | INTRAMUSCULAR | Status: AC
  Administered 2016-02-27: 30 mg via INTRAVENOUS
  Filled 2016-02-26: qty 1

## 2016-02-26 MED ORDER — ONDANSETRON HCL 4 MG/2ML IJ SOLN
4.0000 mg | Freq: Once | INTRAMUSCULAR | Status: AC
  Administered 2016-02-27: 4 mg via INTRAVENOUS
  Filled 2016-02-26: qty 2

## 2016-02-26 NOTE — ED Triage Notes (Signed)
Pt c/o lower abdominal pain and is unable to completely empty her bladder; pt states her last BM was today

## 2016-02-26 NOTE — ED Provider Notes (Signed)
[facility] DEPT Provider Note   CSN: [id number] Arrival date & time: 02/26/16  2225   By signing my name below, I, Brooke [name], attest that this documentation has been prepared under the direction and in the presence of Rolland Porter, MD. Electronically Signed: Hilbert [name], Scribe. 02/26/16. 1:22 AM.  Time seen 23:33 PM  History   Chief Complaint Chief Complaint  Patient presents with  . Abdominal Pain    The history is provided by the patient. No language interpreter was used.   HPI Comments: Brooke [name] is a 41 y.o. female who presents to the Emergency Department complaining of lower abdominal pain that began around 9 pm tonight. The pain is worse with movement. The patient states that she began feeling bad around 7 pm. She went to lay down around 8 pm and felt that she had to urinate and have a BM. She found herself unable to do either for the next hour. She then developed a severe pain in her abdomen. She states that she has never had this pain before. She states that her last normal bowel movement was this morning. She reports some nausea but no vomiting. She also denies blood in urine. She currently smokes a pack a day. She is currently on pain medication for her back and legs which she has been out since the beginning of January. She denies drinking EtOH. She denies fever. She states movement makes the pain worse, sitting on the toilet makes it feel better. She states she keeps having urgency and frequency and has very rare dribbling of urine.  Past Medical History:  Diagnosis Date  . Fibromyalgia   . GERD (gastroesophageal reflux disease)   . Hypertension   . Migraines   . Neuropathy [facility])     Patient Active Problem List   Diagnosis Date Noted  . Obesity 05/05/2015  . Smoker 05/05/2015  . Elevated BP 05/05/2015  . Neck pain 03/06/2015    Past Surgical History:  Procedure Laterality Date  . MOUTH SURGERY      OB History    Gravida Para Term Preterm  AB Living   3 3 3     3    SAB TAB Ectopic Multiple Live Births           3       Home Medications    Prior to Admission medications   Medication Sig Start Date End Date Taking? Authorizing Provider  DULoxetine (CYMBALTA) 60 MG capsule Take 60 mg by mouth daily.   Yes Historical Provider, MD  esomeprazole (NEXIUM) 40 MG capsule Take 40 mg by mouth daily at 12 noon.   Yes Historical Provider, MD  Linaclotide Rolan Lipa) 145 MCG CAPS capsule Take 145 mcg by mouth as needed.    Yes Historical Provider, MD  Oxycodone HCl 10 MG TABS Take 10 mg by mouth 2 (two) times daily as needed (for pain).    Yes Historical Provider, MD  SUMAtriptan (IMITREX) 100 MG tablet Take 100 mg by mouth every 2 (two) hours as needed for migraine. May repeat in 2 hours if headache persists or recurs.   Yes Historical Provider, MD  cephALEXin (KEFLEX) 500 MG capsule Take 1 capsule (500 mg total) by mouth 3 (three) times daily. 02/27/16   Rolland Porter, MD  ketorolac (TORADOL) 10 MG tablet Take 1 tablet (10 mg total) by mouth every 6 (six) hours as needed for moderate pain. 02/27/16   Rolland Porter, MD  medroxyPROGESTERone (DEPO-PROVERA) 150 MG/ML injection ADMINISTER 1 ML(150  MG) IN THE MUSCLE EVERY 3 MONTHS 05/05/15   Roma Schanz, CNM  ondansetron (ZOFRAN) 4 MG tablet Take 1 tablet (4 mg total) by mouth every 8 (eight) hours as needed. 02/27/16   Rolland Porter, MD  oxyCODONE-acetaminophen (PERCOCET/ROXICET) 5-325 MG tablet Take 1 tablet by mouth every 6 (six) hours as needed for severe pain. 02/27/16   Rolland Porter, MD  phenazopyridine (PYRIDIUM) 200 MG tablet Take 1 tablet (200 mg total) by mouth 3 (three) times daily. 02/27/16   Rolland Porter, MD    Family History Family History  Problem Relation Age of Onset  . Hypertension Mother   . Fibromyalgia Mother   . Neuropathy Mother   . Hypertension Father   . Hypertension Sister   . Fibromyalgia Sister   . Neuropathy Sister     Social History Social History  Substance Use Topics    . Smoking status: Current Every Day Smoker    Packs/day: 1.00    Years: 24.00    Types: Cigarettes  . Smokeless tobacco: Never Used  . Alcohol use No  on disability for her back and neuropathy, migraines, HTN   Allergies   Ciprofloxacin; Sulfa antibiotics; and Zithromax [azithromycin]   Review of Systems Review of Systems  Gastrointestinal: Positive for abdominal pain and nausea. Negative for vomiting.  Genitourinary: Positive for difficulty urinating. Negative for hematuria.  Neurological: Negative for headaches.  All other systems reviewed and are negative.    Physical Exam Updated Vital Signs BP 131/87 (BP Location: Left Arm)   Pulse 78   Temp 97.6 F (36.4 C) (Oral)   Resp 20   Ht 5\' 6"  (1.676 m)   Wt 243 lb (110.2 kg)   SpO2 100%   BMI 39.22 kg/m   Vital signs normal    Physical Exam  Constitutional: She is oriented to person, place, and time. She appears well-developed and well-nourished.  Non-toxic appearance. She does not appear ill. She appears distressed.  HENT:  Head: Normocephalic and atraumatic.  Right Ear: External ear normal.  Left Ear: External ear normal.  Nose: Nose normal. No mucosal edema or rhinorrhea.  Mouth/Throat: Oropharynx is clear and moist and mucous membranes are normal. No dental abscesses or uvula swelling.  Eyes: Conjunctivae and EOM are normal. Pupils are equal, round, and reactive to light.  Neck: Normal range of motion and full passive range of motion without pain. Neck supple.  Cardiovascular: Normal rate, regular rhythm and normal heart sounds.  Exam reveals no gallop and no friction rub.   No murmur heard. Pulmonary/Chest: Effort normal and breath sounds normal. No respiratory distress. She has no wheezes. She has no rhonchi. She has no rales. She exhibits no tenderness and no crepitus.  Abdominal: Soft. Normal appearance and bowel sounds are normal. She exhibits no distension. There is no tenderness. There is no rebound and  no guarding.    Tender in lower abdomen worse over bladder. Pain on changing positions  Genitourinary:  Genitourinary Comments: Bilateral cva tenderness. Left worse than right.  Musculoskeletal: Normal range of motion. She exhibits no edema or tenderness.  Moves all extremities well.   Neurological: She is alert and oriented to person, place, and time. She has normal strength. No cranial nerve deficit.  Skin: Skin is warm, dry and intact. No rash noted. No erythema. No pallor.  Psychiatric: She has a normal mood and affect. Her speech is normal and behavior is normal. Her mood appears not anxious.  Nursing note and vitals reviewed.  ED Treatments / Results  DIAGNOSTIC STUDIES: Oxygen Saturation is 100% on RA, normal by my interpretation.     Labs (all labs ordered are listed, but only abnormal results are displayed) Results for orders placed or performed during the hospital encounter of 02/26/16  Urinalysis, Routine w reflex microscopic  Result Value Ref Range   Color, Urine YELLOW YELLOW   APPearance TURBID (A) CLEAR   Specific Gravity, Urine 1.033 (H) 1.005 - 1.030   pH 5.0 5.0 - 8.0   Glucose, UA 50 (A) NEGATIVE mg/dL   Hgb urine dipstick MODERATE (A) NEGATIVE   Bilirubin Urine SMALL (A) NEGATIVE   Ketones, ur NEGATIVE NEGATIVE mg/dL   Protein, ur 30 (A) NEGATIVE mg/dL   Nitrite NEGATIVE NEGATIVE   Leukocytes, UA SMALL (A) NEGATIVE   RBC / HPF 6-30 0 - 5 RBC/hpf   WBC, UA 6-30 0 - 5 WBC/hpf   Bacteria, UA NONE SEEN NONE SEEN   Renal Epithelial 12    Mucous PRESENT   Urine rapid drug screen (hosp performed)  Result Value Ref Range   Opiates NONE DETECTED NONE DETECTED   Cocaine NONE DETECTED NONE DETECTED   Benzodiazepines NONE DETECTED NONE DETECTED   Amphetamines NONE DETECTED NONE DETECTED   Tetrahydrocannabinol NONE DETECTED NONE DETECTED   Barbiturates NONE DETECTED NONE DETECTED   Laboratory interpretation all normal except possible  UTI      Radiology Ct Renal Stone Study  Result Date: 02/27/2016 CLINICAL DATA:  41 year old female with lower abdominal pain and left flank pain. EXAM: CT ABDOMEN AND PELVIS WITHOUT CONTRAST TECHNIQUE: Multidetector CT imaging of the abdomen and pelvis was performed following the standard protocol without IV contrast. COMPARISON:  None. FINDINGS: Evaluation of this exam is limited in the absence of intravenous contrast. Lower chest: The visualized lung bases are clear. No intra-abdominal free air or free fluid. Hepatobiliary: The liver is unremarkable. The gallbladder is filled with stones. No pericholecystic fluid. Ultrasound may provide better evaluation of the gallbladder. Pancreas: Unremarkable. No pancreatic ductal dilatation or surrounding inflammatory changes. Spleen: Normal in size without focal abnormality. Adrenals/Urinary Tract: The adrenal glands are unremarkable. There is a 3 mm distal left ureteral stone with mild left hydronephrosis. Small nonobstructing bilateral renal calculi as well as pain areas of medullary calcific densities noted. There is a 2.8 cm low attenuating lesion in the inferior pole of the left kidney which is indeterminate but most likely represents a cyst. There is no hydronephrosis on the right. The right ureter appears unremarkable. The urinary bladder is collapsed. Stomach/Bowel: There is no evidence of bowel obstruction or active inflammation. Loose stool noted within the colon compatible with diarrheal state. Correlation with clinical exam and stool cultures recommended. Normal appendix. Vascular/Lymphatic: The abdominal aorta and IVC are grossly unremarkable on this noncontrast study. No portal venous gas identified. There is no adenopathy. Reproductive: The uterus anteverted and grossly unremarkable. The ovaries appear unremarkable as well. Other: Small fat containing umbilical hernia. Musculoskeletal: Degenerative changes of the lower lumbar spine with disc bulge  and desiccation with vacuum phenomena at L4-5. No acute fracture. IMPRESSION: 1. A 3 mm distal left ureteral stone with mild left hydronephrosis. Correlation with urinalysis recommended to exclude superimposed UTI. 2. Small nonobstructing bilateral renal calculi. No hydronephrosis on the right. 3. Cholelithiasis. Ultrasound may provide better evaluation of the gallbladder. 4. Probable diarrheal state. Correlation with clinical exam and stool cultures recommended. No bowel obstruction or active inflammation. Normal appendix. Electronically Signed   By: Anner Crete  M.D.   On: 02/27/2016 01:03    Procedures Procedures (including critical care time)  Medications Ordered in ED Medications  ketorolac (TORADOL) 30 MG/ML injection 30 mg (30 mg Intravenous Given 02/27/16 0008)  ondansetron (ZOFRAN) injection 4 mg (4 mg Intravenous Given 02/27/16 0008)  fentaNYL (SUBLIMAZE) injection 50 mcg (50 mcg Intravenous Given 02/27/16 0148)  cefTRIAXone (ROCEPHIN) 1 g in dextrose 5 % 50 mL IVPB (1 g Intravenous New Bag/Given 02/27/16 0321)     Initial Impression / Assessment and Plan / ED Course  I have reviewed the triage vital signs and the nursing notes.  Pertinent labs & imaging results that were available during my care of the patient were reviewed by me and considered in my medical decision making (see chart for details).  Patient was given IV Toradol for suspected renal stone. CT scan was ordered.  Patient continued to have some pain was given fentanyl.  After reviewing her UA she was given IV Rocephin for possibly UTI.  At time of discharge patient is much improved. She is able to hold conversation.   Database Shows patient was getting #90 oxycodone 10 mg tablets monthly, last filled December 13. She got 21 tablets on January 5.  Final Clinical Impressions(s) / ED Diagnoses   Final diagnoses:  Left ureteral stone  Urinary tract infection with hematuria, site unspecified    New  Prescriptions New Prescriptions   CEPHALEXIN (KEFLEX) 500 MG CAPSULE    Take 1 capsule (500 mg total) by mouth 3 (three) times daily.   KETOROLAC (TORADOL) 10 MG TABLET    Take 1 tablet (10 mg total) by mouth every 6 (six) hours as needed for moderate pain.   ONDANSETRON (ZOFRAN) 4 MG TABLET    Take 1 tablet (4 mg total) by mouth every 8 (eight) hours as needed.   OXYCODONE-ACETAMINOPHEN (PERCOCET/ROXICET) 5-325 MG TABLET    Take 1 tablet by mouth every 6 (six) hours as needed for severe pain.   PHENAZOPYRIDINE (PYRIDIUM) 200 MG TABLET    Take 1 tablet (200 mg total) by mouth 3 (three) times daily.   Plan discharge  Rolland Porter, MD, FACEP  I personally performed the services described in this documentation, which was scribed in my presence. The recorded information has been reviewed and considered.  Rolland Porter, MD, Barbette Or, MD 02/27/16 [phone]

## 2016-02-27 DIAGNOSIS — N201 Calculus of ureter: Secondary | ICD-10-CM | POA: Diagnosis not present

## 2016-02-27 LAB — URINALYSIS, ROUTINE W REFLEX MICROSCOPIC
BACTERIA UA: NONE SEEN
GLUCOSE, UA: 50 mg/dL — AB
KETONES UR: NEGATIVE mg/dL
Nitrite: NEGATIVE
PROTEIN: 30 mg/dL — AB
RENAL EPITHELIAL (UACOMP): 12
Specific Gravity, Urine: 1.033 — ABNORMAL HIGH (ref 1.005–1.030)
pH: 5 (ref 5.0–8.0)

## 2016-02-27 LAB — RAPID URINE DRUG SCREEN, HOSP PERFORMED
Amphetamines: NOT DETECTED
BENZODIAZEPINES: NOT DETECTED
Barbiturates: NOT DETECTED
COCAINE: NOT DETECTED
OPIATES: NOT DETECTED
Tetrahydrocannabinol: NOT DETECTED

## 2016-02-27 MED ORDER — ONDANSETRON HCL 4 MG PO TABS: 4.0000 mg | ORAL_TABLET | Freq: Three times a day (TID) | ORAL | 0 refills | Status: DC | PRN

## 2016-02-27 MED ORDER — OXYCODONE-ACETAMINOPHEN 5-325 MG PO TABS: 1.0000 | ORAL_TABLET | Freq: Four times a day (QID) | ORAL | 0 refills | Status: DC | PRN

## 2016-02-27 MED ORDER — DEXTROSE 5 % IV SOLN
1.0000 g | Freq: Once | INTRAVENOUS | Status: AC
  Administered 2016-02-27: 1 g via INTRAVENOUS
  Filled 2016-02-27: qty 10

## 2016-02-27 MED ORDER — PHENAZOPYRIDINE HCL 200 MG PO TABS: 200.0000 mg | ORAL_TABLET | Freq: Three times a day (TID) | ORAL | 0 refills | Status: DC

## 2016-02-27 MED ORDER — FENTANYL CITRATE (PF) 100 MCG/2ML IJ SOLN
50.0000 ug | Freq: Once | INTRAMUSCULAR | Status: AC
  Administered 2016-02-27: 50 ug via INTRAVENOUS
  Filled 2016-02-27: qty 2

## 2016-02-27 MED ORDER — KETOROLAC TROMETHAMINE 10 MG PO TABS: 10.0000 mg | ORAL_TABLET | Freq: Four times a day (QID) | ORAL | 0 refills | Status: DC | PRN

## 2016-02-27 MED ORDER — CEPHALEXIN 500 MG PO CAPS: 500.0000 mg | ORAL_CAPSULE | Freq: Three times a day (TID) | ORAL | 0 refills | Status: DC

## 2016-02-27 NOTE — Discharge Instructions (Signed)
Take the medications as precribed. Return to the ED if you get a fever or have uncontrolled vomiting or pain.

## 2016-02-27 NOTE — ED Notes (Signed)
Pt states understanding of care given and follow up instructions.  Pt ambulated to waiting room, alert, oriented, with steady gait.  SO to transport home

## 2016-02-28 LAB — URINE CULTURE: Culture: NO GROWTH

## 2016-04-29 ENCOUNTER — Other Ambulatory Visit: Payer: Self-pay | Admitting: Women's Health

## 2016-05-06 ENCOUNTER — Ambulatory Visit (INDEPENDENT_AMBULATORY_CARE_PROVIDER_SITE_OTHER): Payer: Medicare Other | Admitting: Adult Health

## 2016-05-06 ENCOUNTER — Encounter: Payer: Self-pay | Admitting: Adult Health

## 2016-05-06 VITALS — BP 134/80 | HR 85 | Ht 67.0 in | Wt 248.0 lb

## 2016-05-06 DIAGNOSIS — R5383 Other fatigue: Secondary | ICD-10-CM | POA: Insufficient documentation

## 2016-05-06 DIAGNOSIS — Z01419 Encounter for gynecological examination (general) (routine) without abnormal findings: Secondary | ICD-10-CM

## 2016-05-06 DIAGNOSIS — Z3042 Encounter for surveillance of injectable contraceptive: Secondary | ICD-10-CM

## 2016-05-06 DIAGNOSIS — Z3202 Encounter for pregnancy test, result negative: Secondary | ICD-10-CM

## 2016-05-06 LAB — POCT URINE PREGNANCY: PREG TEST UR: NEGATIVE

## 2016-05-06 MED ORDER — MEDROXYPROGESTERONE ACETATE 150 MG/ML IM SUSP: 150.0000 mg | INTRAMUSCULAR | 4 refills | Status: DC

## 2016-05-06 MED ORDER — MEDROXYPROGESTERONE ACETATE 150 MG/ML IM SUSP
150.0000 mg | Freq: Once | INTRAMUSCULAR | Status: AC
  Administered 2016-05-06: 150 mg via INTRAMUSCULAR

## 2016-05-06 NOTE — Progress Notes (Signed)
Pt given DepoProvera 150mg  IM right deltoid without complications. Advised pt to return in 12 weeks for next depo injection. Pt verbalized understanding.

## 2016-05-06 NOTE — Progress Notes (Signed)
Patient ID: Brooke [name], female   DOB: [birth year], 41 y.o.   MRN: [id number] History of Present Illness: Brooke [name] is a 41 year old white female in for well woman gyn exam,she had normal pap with negative HPV 05/05/15.Also getting depo today.She was seen at [facility] in past and also at Dr Randel Pigg office in [place].She lives in [place] and is engaged.     Current Medications, Allergies, Past Medical History, Past Surgical History, Family History and Social History were reviewed in [employer] record.     Review of Systems: Patient denies any daily headaches, hearing loss, blurred vision, shortness of breath, chest pain, abdominal pain, problems with bowel movements(on linzes), urination, or intercourse. No joint pain or mood swings.She said thyroid labs elevated in past and has not had rechecked. Throat feels like something in it firs thing in the morning. Tired at times.   Physical Exam:BP 134/80 (BP Location: Left Arm, Patient Position: Sitting, Cuff Size: Large)   Pulse 85   Ht 5\' 7"  (1.702 m)   Wt 248 lb (112.5 kg)   BMI 38.84 kg/m UPT negative, got depo provera injection today. General:  Well developed, well nourished, no acute distress Skin:  Warm and dry Neck:  Midline trachea, normal thyroid, good ROM, no lymphadenopathy Lungs; Clear to auscultation bilaterally Breast:  No dominant palpable mass, retraction, or nipple discharge Cardiovascular: Regular rate and rhythm Abdomen:  Soft, non tender, no hepatosplenomegaly Pelvic:  External genitalia is normal in appearance, no lesions.Skin tags left inner thigh.  The vagina is normal in appearance. Urethra has no lesions or masses. The cervix is bulbous.  Uterus is felt to be normal size, shape, and contour.  No adnexal masses or tenderness noted.Bladder is non tender, no masses felt. Extremities/musculoskeletal:  No swelling or varicosities noted, no clubbing or cyanosis Psych:  No mood changes, alert and  cooperative,seems happy PHQ 2 score 0.  Impression:  1. Well woman exam with routine gynecological exam   2. Surveillance for Depo-Provera contraception   3. Pregnancy examination or test, negative result   4. Tired      Plan: Refilled depo provera 150 mg x 1 year Depo in 12 weeks  Check TSH Physical in 1 year Mammogram at 41  Pap 2020 Colonoscopy at 69

## 2016-05-07 ENCOUNTER — Telehealth: Payer: Self-pay | Admitting: Adult Health

## 2016-05-07 LAB — TSH: TSH: 3.29 u[IU]/mL (ref 0.450–4.500)

## 2016-05-07 NOTE — Telephone Encounter (Signed)
Left message that TSH normal.

## 2016-05-11 ENCOUNTER — Ambulatory Visit: Payer: Medicare Other

## 2016-07-29 ENCOUNTER — Ambulatory Visit (INDEPENDENT_AMBULATORY_CARE_PROVIDER_SITE_OTHER): Payer: Medicare Other | Admitting: *Deleted

## 2016-07-29 ENCOUNTER — Encounter: Payer: Self-pay | Admitting: *Deleted

## 2016-07-29 DIAGNOSIS — Z3202 Encounter for pregnancy test, result negative: Secondary | ICD-10-CM

## 2016-07-29 DIAGNOSIS — Z3042 Encounter for surveillance of injectable contraceptive: Secondary | ICD-10-CM | POA: Diagnosis not present

## 2016-07-29 DIAGNOSIS — Z308 Encounter for other contraceptive management: Secondary | ICD-10-CM

## 2016-07-29 LAB — POCT URINE PREGNANCY: Preg Test, Ur: NEGATIVE

## 2016-07-29 MED ORDER — MEDROXYPROGESTERONE ACETATE 150 MG/ML IM SUSP
150.0000 mg | Freq: Once | INTRAMUSCULAR | Status: AC
  Administered 2016-07-29: 150 mg via INTRAMUSCULAR

## 2016-07-29 NOTE — Progress Notes (Signed)
Pt here for Depo. Pt tolerated shot well. Return in 12 weeks for next shot. JSY 

## 2016-09-22 NOTE — Progress Notes (Signed)
Patient ID: Brooke [name], female    DOB: 1975/07/05, 41 y.o.   MRN: [id number]  Chief Complaint  Patient presents with  . Medication Management    patient has been without medications  . Abdominal Pain  . Fatigue  . Cough    URI?    Allergies Ciprofloxacin; Sulfa antibiotics; and Zithromax [azithromycin]  Subjective:   Brooke [name] is a 41 y.o. female who presents to [facility] today.  HPI Here to establish care. Is transferring care from a doctor in [place] b/c does not want to drive. Lives in [place] and is on disability. Has had a cough for 2 days.    Reports that has had abdominal pain for six months. The pain starts in the center of abdomen near epigastric region, only occurs after she has been standing for about 15 minutes. Then it starts, abdomen pain is sharp and then abdomen feels very tight. Then when she sits down the pain goes away. But if stands up, the pain will recur. Reports that belly is fat but it gets tight and hard. Has tried to loose weight. Has lost some weight, used to weight over 300 pounds and lost but then gained some back.    Cough  This is a new problem. The current episode started yesterday. The problem has been unchanged. The problem occurs hourly. The cough is non-productive. Pertinent negatives include no chest pain, chills, ear congestion, ear pain, eye redness, fever, hemoptysis, myalgias, nasal congestion, rash, sore throat, shortness of breath, sweats, weight loss or wheezing. The symptoms are aggravated by pollens. Risk factors for lung disease include smoking/tobacco exposure. She has tried nothing for the symptoms. Her past medical history is significant for bronchitis and environmental allergies. There is no history of asthma, bronchiectasis, COPD, emphysema or pneumonia.  Migraine   This is a chronic problem. The current episode started more than 1 year ago (dx at 41 year old). The problem occurs monthly. The problem has  been unchanged. The pain is located in the bilateral and retro-orbital region. The pain radiates to the face. The pain quality is similar to prior headaches. The quality of the pain is described as aching, sharp and stabbing. The pain is at a severity of 7/10. Associated symptoms include coughing, phonophobia and photophobia. Pertinent negatives include no abdominal pain, abnormal behavior, anorexia, blurred vision, ear pain, eye pain, eye redness, eye watering, facial sweating, fever, hearing loss, loss of balance, nausea, sore throat, vomiting, weakness or weight loss. The symptoms are aggravated by bright light (unabel to tell what causes them except light). She has tried triptans for the symptoms. The treatment provided significant relief. Her past medical history is significant for obesity and sinus disease. There is no history of hypertension or recent head traumas.  Abdominal Pain  This is a chronic problem. The current episode started more than 1 month ago (has been this way for six months). The onset quality is sudden. The problem occurs daily. The problem has been unchanged. The pain is at a severity of 7/10. The pain is moderate. The quality of the pain is sharp. The abdominal pain radiates to the back. Associated symptoms include constipation. Pertinent negatives include no anorexia, belching, dysuria, fever, frequency, hematochezia, hematuria, myalgias, nausea, vomiting or weight loss. Exacerbated by: standing. Relieved by: sitting down.    Past Medical History:  Diagnosis Date  . Disc degeneration, lumbar   . Fibromyalgia   . GERD (gastroesophageal reflux disease)   . Hypertension   .  Insomnia   . Knee pain   . Migraines   . Nerve damage   . Neuropathy   . Overweight   . Sinusitis, chronic   . Tendonitis     Past Surgical History:  Procedure Laterality Date  . MOUTH SURGERY     dental surgery /tooth extraction    Family History  Problem Relation Age of Onset  . Hypertension  Mother   . Fibromyalgia Mother   . Neuropathy Mother   . Hypertension Father   . Hypertension Sister   . Fibromyalgia Sister   . Neuropathy Sister      Social History   Social History  . Marital status: Single    Spouse name: N/A  . Number of children: N/A  . Years of education: N/A   Social History Main Topics  . Smoking status: Current Every Day Smoker    Packs/day: 0.50    Years: 24.00    Types: Cigarettes  . Smokeless tobacco: Never Used  . Alcohol use No  . Drug use: No  . Sexual activity: Yes    Birth control/ protection: Injection   Other Topics Concern  . None   Social History Narrative   Engaged to get married. Two children, age 60 and 55. They go to school. Does not work outside the house. Lives in [place]. Eats all food groups. Exercises minimally. Has another child that lives in [place] with his father.     Review of Systems  Constitutional: Negative for chills, fever and weight loss.  HENT: Negative for ear pain, hearing loss and sore throat.   Eyes: Positive for photophobia. Negative for blurred vision, pain and redness.  Respiratory: Positive for cough. Negative for hemoptysis, shortness of breath and wheezing.   Cardiovascular: Negative for chest pain.  Gastrointestinal: Positive for constipation. Negative for abdominal pain, anal bleeding, anorexia, blood in stool, hematochezia, nausea and vomiting.       Has used the [place] for years. Uses it sporadically for constipation. Was diagnosed with irritable bowel syndrome per patient. Has never had a colonoscopy.   Genitourinary: Negative for dysuria, frequency and hematuria.  Musculoskeletal: Negative for myalgias.  Skin: Negative for rash.  Allergic/Immunologic: Positive for environmental allergies.  Neurological: Negative for weakness and loss of balance.     Objective:   BP 120/86 (BP Location: Left Arm, Patient Position: Sitting, Cuff Size: Normal)   Pulse 80   Temp (!) 97.4 F (36.3 C) (Other  (Comment))   Resp 16   Ht 5\' 7"  (1.702 m)   Wt 254 lb (115.2 kg)   SpO2 97%   BMI 39.78 kg/m   Physical Exam  Constitutional: She is oriented to person, place, and time. She appears well-developed and well-nourished. No distress.  HENT:  Head: Normocephalic and atraumatic.  Eyes: Pupils are equal, round, and reactive to light. EOM are normal.  Neck: Neck supple. No thyromegaly present.  Cardiovascular: Normal rate, regular rhythm and normal heart sounds.   Pulmonary/Chest: Effort normal and breath sounds normal.  Abdominal: Soft. Bowel sounds are normal. She exhibits no ascites. There is no hepatosplenomegaly, splenomegaly or hepatomegaly. There is tenderness in the epigastric area. There is guarding. There is no rebound.  Central abdominal obesity, abdominal wall is tight upon standing and epigastric TTP.   Neurological: She is alert and oriented to person, place, and time.  Skin: Skin is warm and dry. She is not diaphoretic. No erythema.  Psychiatric: She has a normal mood and affect. Her behavior is  normal. Judgment and thought content normal.     Assessment and Plan  1. Migraine without aura and responsive to treatment Patient counseled in detail regarding the risks of medication. Told to call or return to clinic if develop any worrisome signs or symptoms. Patient voiced understanding.  Counseled regarding worrisome s/s of headache and if occur with call 911 or go to the ED.  Avoidance of triggers recommended.  - SUMAtriptan (IMITREX) 100 MG tablet; Take 1 tablet (100 mg total) by mouth every 2 (two) hours as needed for migraine. May repeat in 2 hours if headache persists or recurs.  Dispense: 9 tablet; Refill: 3  2. Abdominal pain, unspecified abdominal location  - Basic metabolic panel - Hepatic function panel - CBC with Differential/Platelet - Lipase - Amylase - US Abdomen Complete; Future Discussed possible etiologies of pain. Check labs/studies. Weight loss  recommended. Counseled regarding worrisome s/s of pain and if develop RTC.  3. Seasonal allergic rhinitis due to pollen Chronic sinusitis, but not on allergy medication and allergic to pollen. Trial. Reports that claritin and zyrtec did not work for her after a long time.  - levocetirizine (XYZAL) 5 MG tablet; Take 1 tablet (5 mg total) by mouth every evening.  Dispense: 90 tablet; Refill: 1  4. Chronic constipation Patient counseled in detail regarding the risks of medication. Told to call or return to clinic if develop any worrisome signs or symptoms. Patient voiced understanding.   - linaclotide (LINZESS) 145 MCG CAPS capsule; Take one a day as needed  Dispense: 30 capsule; Refill: 0  5. Gastroesophageal reflux disease without esophagitis Discussed that will fill this now. Discussed that patient will likely need EGD since has not ever had and chronically takes this medication. GERD precautions. Food triggers discussed. Weight loss recommended.  - esomeprazole (NEXIUM) 40 MG capsule; Take 1 capsule (40 mg total) by mouth daily at 12 noon.  Dispense: 90 capsule; Refill: 1  6. Chronic fatigue Check labs. Exercise and lifestyle modifications recommended.  - TSH  7. Screen for STD (sexually transmitted disease) Patient reported she would like this checked but is not symptomatic.  - HIV antibody - RPR - Hepatitis panel, acute  8. Morbid obesity ([place]) Discussed that this could be impacting life in a major way. Discussed that this could be cause of many of complaints.  - Hemoglobin A1c  9. Chronic pain of right knee Will place referral. Has seen orthopedics in past and has chronic neck, back, hand, knee pain. We did not have time to discuss this in detail but would like to be seen by ortho. I am in agreement.  - Ambulatory referral to Orthopedic Surgery  10. Cough, two days duration. Patient wanting antibiotics, but explained in detail that likely due to post nasal drip or allergies.  Counseled regarding s/s of bacterial infection and counseled regarding worrisome s/s. Call office if develop SOB, fever, n/v, chest pain, or is not getting better in a week or getting worse.   11. Smoking cessation recommended. The 5 A's Model for treating Tobacco Use and Dependence was used today. I have identified and documented tobacco use status for this patient. I have urged the patient to quit tobacco use. At this time, the patient is unwilling and not ready to attempt to quit. I have provided patient with information regarding risks, cessation techniques, and interventions that might increase future attempts to quit smoking. I will plan on again addressing tobacco dependence at the next visit.     No  Follow-up on file. Caren Macadam, MD 09/30/2016

## 2016-09-30 ENCOUNTER — Encounter (INDEPENDENT_AMBULATORY_CARE_PROVIDER_SITE_OTHER): Payer: Self-pay

## 2016-09-30 ENCOUNTER — Encounter: Payer: Self-pay | Admitting: Family Medicine

## 2016-09-30 ENCOUNTER — Ambulatory Visit (INDEPENDENT_AMBULATORY_CARE_PROVIDER_SITE_OTHER): Payer: Medicare Other | Admitting: Family Medicine

## 2016-09-30 VITALS — BP 120/86 | HR 80 | Temp 97.4°F | Resp 16 | Ht 67.0 in | Wt 254.0 lb

## 2016-09-30 DIAGNOSIS — M25561 Pain in right knee: Secondary | ICD-10-CM | POA: Diagnosis not present

## 2016-09-30 DIAGNOSIS — Z720 Tobacco use: Secondary | ICD-10-CM | POA: Diagnosis not present

## 2016-09-30 DIAGNOSIS — K219 Gastro-esophageal reflux disease without esophagitis: Secondary | ICD-10-CM

## 2016-09-30 DIAGNOSIS — G43009 Migraine without aura, not intractable, without status migrainosus: Secondary | ICD-10-CM | POA: Diagnosis not present

## 2016-09-30 DIAGNOSIS — G8929 Other chronic pain: Secondary | ICD-10-CM | POA: Diagnosis not present

## 2016-09-30 DIAGNOSIS — R109 Unspecified abdominal pain: Secondary | ICD-10-CM

## 2016-09-30 DIAGNOSIS — J301 Allergic rhinitis due to pollen: Secondary | ICD-10-CM

## 2016-09-30 DIAGNOSIS — R5382 Chronic fatigue, unspecified: Secondary | ICD-10-CM | POA: Diagnosis not present

## 2016-09-30 DIAGNOSIS — Z113 Encounter for screening for infections with a predominantly sexual mode of transmission: Secondary | ICD-10-CM

## 2016-09-30 DIAGNOSIS — K5909 Other constipation: Secondary | ICD-10-CM | POA: Diagnosis not present

## 2016-09-30 DIAGNOSIS — G43909 Migraine, unspecified, not intractable, without status migrainosus: Secondary | ICD-10-CM | POA: Insufficient documentation

## 2016-09-30 HISTORY — DX: Migraine without aura, not intractable, without status migrainosus: G43.009

## 2016-09-30 MED ORDER — SUMATRIPTAN SUCCINATE 100 MG PO TABS: 100.0000 mg | ORAL_TABLET | ORAL | 3 refills | Status: AC | PRN

## 2016-09-30 MED ORDER — LINACLOTIDE 145 MCG PO CAPS: ORAL_CAPSULE | ORAL | 0 refills | Status: DC

## 2016-09-30 MED ORDER — LEVOCETIRIZINE DIHYDROCHLORIDE 5 MG PO TABS: 5.0000 mg | ORAL_TABLET | Freq: Every evening | ORAL | 1 refills | Status: DC

## 2016-09-30 MED ORDER — ESOMEPRAZOLE MAGNESIUM 40 MG PO CPDR: 40.0000 mg | DELAYED_RELEASE_CAPSULE | Freq: Every day | ORAL | 1 refills | Status: DC

## 2016-09-30 NOTE — Patient Instructions (Signed)
Steps to Quit Smoking Smoking tobacco can be harmful to your health and can affect almost every organ in your body. Smoking puts you, and those around you, at risk for developing many serious chronic diseases. Quitting smoking is difficult, but it is one of the best things that you can do for your health. It is never too late to quit. What are the benefits of quitting smoking? When you quit smoking, you lower your risk of developing serious diseases and conditions, such as:  Lung cancer or lung disease, such as COPD.  Heart disease.  Stroke.  Heart attack.  Infertility.  Osteoporosis and bone fractures.  Additionally, symptoms such as coughing, wheezing, and shortness of breath may get better when you quit. You may also find that you get sick less often because your body is stronger at fighting off colds and infections. If you are pregnant, quitting smoking can help to reduce your chances of having a baby of low birth weight. How do I get ready to quit? When you decide to quit smoking, create a plan to make sure that you are successful. Before you quit:  Pick a date to quit. Set a date within the next two weeks to give you time to prepare.  Write down the reasons why you are quitting. Keep this list in places where you will see it often, such as on your bathroom mirror or in your car or wallet.  Identify the people, places, things, and activities that make you want to smoke (triggers) and avoid them. Make sure to take these actions: ? Throw away all cigarettes at home, at work, and in your car. ? Throw away smoking accessories, such as ashtrays and lighters. ? Clean your car and make sure to empty the ashtray. ? Clean your home, including curtains and carpets.  Tell your family, friends, and coworkers that you are quitting. Support from your loved ones can make quitting easier.  Talk with your health care provider about your options for quitting smoking.  Find out what treatment  options are covered by your health insurance.  What strategies can I use to quit smoking? Talk with your healthcare provider about different strategies to quit smoking. Some strategies include:  Quitting smoking altogether instead of gradually lessening how much you smoke over a period of time. Research shows that quitting "cold turkey" is more successful than gradually quitting.  Attending in-person counseling to help you build problem-solving skills. You are more likely to have success in quitting if you attend several counseling sessions. Even short sessions of 10 minutes can be effective.  Finding resources and support systems that can help you to quit smoking and remain smoke-free after you quit. These resources are most helpful when you use them often. They can include: ? Online chats with a counselor. ? Telephone quitlines. ? Printed self-help materials. ? Support groups or group counseling. ? Text messaging programs. ? Mobile phone applications.  Taking medicines to help you quit smoking. (If you are pregnant or breastfeeding, talk with your health care provider first.) Some medicines contain nicotine and some do not. Both types of medicines help with cravings, but the medicines that include nicotine help to relieve withdrawal symptoms. Your health care provider may recommend: ? Nicotine patches, gum, or lozenges. ? Nicotine inhalers or sprays. ? Non-nicotine medicine that is taken by mouth.  Talk with your health care provider about combining strategies, such as taking medicines while you are also receiving in-person counseling. Using these two strategies together   makes you more likely to succeed in quitting than if you used either strategy on its own. If you are pregnant or breastfeeding, talk with your health care provider about finding counseling or other support strategies to quit smoking. Do not take medicine to help you quit smoking unless told to do so by your health care  provider. What things can I do to make it easier to quit? Quitting smoking might feel overwhelming at first, but there is a lot that you can do to make it easier. Take these important actions:  Reach out to your family and friends and ask that they support and encourage you during this time. Call telephone quitlines, reach out to support groups, or work with a counselor for support.  Ask people who smoke to avoid smoking around you.  Avoid places that trigger you to smoke, such as bars, parties, or smoke-break areas at work.  Spend time around people who do not smoke.  Lessen stress in your life, because stress can be a smoking trigger for some people. To lessen stress, try: ? Exercising regularly. ? Deep-breathing exercises. ? Yoga. ? Meditating. ? Performing a body scan. This involves closing your eyes, scanning your body from head to toe, and noticing which parts of your body are particularly tense. Purposefully relax the muscles in those areas.  Download or purchase mobile phone or tablet apps (applications) that can help you stick to your quit plan by providing reminders, tips, and encouragement. There are many free apps, such as QuitGuide from the CDC (Centers for Disease Control and Prevention). You can find other support for quitting smoking (smoking cessation) through smokefree.gov and other websites.  How will I feel when I quit smoking? Within the first 24 hours of quitting smoking, you may start to feel some withdrawal symptoms. These symptoms are usually most noticeable 2-3 days after quitting, but they usually do not last beyond 2-3 weeks. Changes or symptoms that you might experience include:  Mood swings.  Restlessness, anxiety, or irritation.  Difficulty concentrating.  Dizziness.  Strong cravings for sugary foods in addition to nicotine.  Mild weight gain.  Constipation.  Nausea.  Coughing or a sore throat.  Changes in how your medicines work in your  body.  A depressed mood.  Difficulty sleeping (insomnia).  After the first 2-3 weeks of quitting, you may start to notice more positive results, such as:  Improved sense of smell and taste.  Decreased coughing and sore throat.  Slower heart rate.  Lower blood pressure.  Clearer skin.  The ability to breathe more easily.  Fewer sick days.  Quitting smoking is very challenging for most people. Do not get discouraged if you are not successful the first time. Some people need to make many attempts to quit before they achieve long-term success. Do your best to stick to your quit plan, and talk with your health care provider if you have any questions or concerns. This information is not intended to replace advice given to you by your health care provider. Make sure you discuss any questions you have with your health care provider. Document Released: 12/15/2000 Document Revised: 08/19/2015 Document Reviewed: 05/07/2014 Elsevier Interactive Patient Education  2017 Elsevier Inc.  

## 2016-10-01 ENCOUNTER — Encounter: Payer: Self-pay | Admitting: Family Medicine

## 2016-10-01 LAB — CBC WITH DIFFERENTIAL/PLATELET
Basophils Absolute: 34 cells/uL (ref 0–200)
Basophils Relative: 0.4 %
EOS ABS: 213 {cells}/uL (ref 15–500)
Eosinophils Relative: 2.5 %
HCT: 46.2 % — ABNORMAL HIGH (ref 35.0–45.0)
HEMOGLOBIN: 16.3 g/dL — AB (ref 11.7–15.5)
LYMPHS ABS: 2338 {cells}/uL (ref 850–3900)
MCH: 32.6 pg (ref 27.0–33.0)
MCHC: 35.3 g/dL (ref 32.0–36.0)
MCV: 92.4 fL (ref 80.0–100.0)
MPV: 11.1 fL (ref 7.5–12.5)
Monocytes Relative: 6.1 %
NEUTROS PCT: 63.5 %
Neutro Abs: 5398 cells/uL (ref 1500–7800)
Platelets: 228 10*3/uL (ref 140–400)
RBC: 5 10*6/uL (ref 3.80–5.10)
RDW: 12.3 % (ref 11.0–15.0)
Total Lymphocyte: 27.5 %
WBC: 8.5 10*3/uL (ref 3.8–10.8)
WBCMIX: 519 {cells}/uL (ref 200–950)

## 2016-10-01 LAB — HEPATITIS PANEL, ACUTE
HEP B S AG: NONREACTIVE
Hep A IgM: NONREACTIVE
Hep B C IgM: NONREACTIVE
Hepatitis C Ab: NONREACTIVE
SIGNAL TO CUT-OFF: 0.01 (ref <1.00)

## 2016-10-01 LAB — HEPATIC FUNCTION PANEL
AG RATIO: 1.9 (calc) (ref 1.0–2.5)
ALBUMIN MSPROF: 4.5 g/dL (ref 3.6–5.1)
ALT: 14 U/L (ref 6–29)
AST: 13 U/L (ref 10–30)
Alkaline phosphatase (APISO): 66 U/L (ref 33–115)
BILIRUBIN DIRECT: 0.1 mg/dL (ref 0.0–0.2)
Globulin: 2.4 g/dL (calc) (ref 1.9–3.7)
Indirect Bilirubin: 0.5 mg/dL (calc) (ref 0.2–1.2)
Total Bilirubin: 0.6 mg/dL (ref 0.2–1.2)
Total Protein: 6.9 g/dL (ref 6.1–8.1)

## 2016-10-01 LAB — RPR: RPR Ser Ql: NONREACTIVE

## 2016-10-01 LAB — AMYLASE: AMYLASE: 29 U/L (ref 21–101)

## 2016-10-01 LAB — BASIC METABOLIC PANEL
BUN: 11 mg/dL (ref 7–25)
CO2: 24 mmol/L (ref 20–32)
CREATININE: 0.92 mg/dL (ref 0.50–1.10)
Calcium: 9.3 mg/dL (ref 8.6–10.2)
Chloride: 104 mmol/L (ref 98–110)
GLUCOSE: 76 mg/dL (ref 65–99)
Potassium: 4.5 mmol/L (ref 3.5–5.3)
SODIUM: 137 mmol/L (ref 135–146)

## 2016-10-01 LAB — HIV ANTIBODY (ROUTINE TESTING W REFLEX): HIV: NONREACTIVE

## 2016-10-01 LAB — LIPASE: Lipase: 17 U/L (ref 7–60)

## 2016-10-01 LAB — TSH: TSH: 3.16 mIU/L

## 2016-10-01 LAB — HEMOGLOBIN A1C
EAG (MMOL/L): 5.4 (calc)
Hgb A1c MFr Bld: 5 % of total Hgb (ref <5.7)
Mean Plasma Glucose: 97 (calc)

## 2016-10-04 ENCOUNTER — Ambulatory Visit (HOSPITAL_COMMUNITY): Payer: Medicare Other

## 2016-10-06 ENCOUNTER — Ambulatory Visit (HOSPITAL_COMMUNITY)
Admission: RE | Admit: 2016-10-06 | Discharge: 2016-10-06 | Disposition: A | Discharge status: Home / Self Care | Payer: Medicare Other | Source: Ambulatory Visit | Attending: Family Medicine | Admitting: Family Medicine

## 2016-10-06 DIAGNOSIS — R109 Unspecified abdominal pain: Secondary | ICD-10-CM | POA: Diagnosis present

## 2016-10-06 DIAGNOSIS — K8020 Calculus of gallbladder without cholecystitis without obstruction: Secondary | ICD-10-CM | POA: Diagnosis not present

## 2016-10-06 NOTE — Telephone Encounter (Signed)
Please call patient and advise that [name] ultrasound did not show any acute problems. She does have gallstones but they are not causing any acute problems. We will discuss the ultrasoud at [name] follow up. Brooke [name]. [name], MD

## 2016-10-12 ENCOUNTER — Encounter: Payer: Self-pay | Admitting: Family Medicine

## 2016-10-14 ENCOUNTER — Ambulatory Visit: Payer: Medicare Other | Admitting: Orthopaedic Surgery

## 2016-10-20 ENCOUNTER — Other Ambulatory Visit: Payer: Self-pay | Admitting: Women's Health

## 2016-10-21 ENCOUNTER — Ambulatory Visit (INDEPENDENT_AMBULATORY_CARE_PROVIDER_SITE_OTHER): Payer: Medicare Other | Admitting: *Deleted

## 2016-10-21 DIAGNOSIS — Z3042 Encounter for surveillance of injectable contraceptive: Secondary | ICD-10-CM | POA: Diagnosis not present

## 2016-10-21 DIAGNOSIS — Z3202 Encounter for pregnancy test, result negative: Secondary | ICD-10-CM

## 2016-10-21 LAB — POCT URINE PREGNANCY: Preg Test, Ur: NEGATIVE

## 2016-10-21 MED ORDER — MEDROXYPROGESTERONE ACETATE 150 MG/ML IM SUSP
150.0000 mg | Freq: Once | INTRAMUSCULAR | Status: AC
  Administered 2016-10-21: 150 mg via INTRAMUSCULAR

## 2016-10-21 NOTE — Progress Notes (Signed)
Pt in for depo provera injection. Given in right deltoid. Patient tolerated well. Instructed to return between Jan 3-Jan 17.

## 2016-10-29 ENCOUNTER — Ambulatory Visit: Payer: Medicare Other | Admitting: Family Medicine

## 2016-11-09 ENCOUNTER — Encounter: Payer: Self-pay | Admitting: Family Medicine

## 2016-11-09 ENCOUNTER — Ambulatory Visit (INDEPENDENT_AMBULATORY_CARE_PROVIDER_SITE_OTHER): Payer: Medicare Other | Admitting: Family Medicine

## 2016-11-09 VITALS — BP 126/74 | HR 76 | Temp 98.4°F | Resp 16 | Ht 67.0 in | Wt 251.8 lb

## 2016-11-09 DIAGNOSIS — J301 Allergic rhinitis due to pollen: Secondary | ICD-10-CM

## 2016-11-09 DIAGNOSIS — Z9109 Other allergy status, other than to drugs and biological substances: Secondary | ICD-10-CM | POA: Diagnosis not present

## 2016-11-09 DIAGNOSIS — G5603 Carpal tunnel syndrome, bilateral upper limbs: Secondary | ICD-10-CM | POA: Diagnosis not present

## 2016-11-09 DIAGNOSIS — J329 Chronic sinusitis, unspecified: Secondary | ICD-10-CM

## 2016-11-09 DIAGNOSIS — E785 Hyperlipidemia, unspecified: Secondary | ICD-10-CM

## 2016-11-09 DIAGNOSIS — D492 Neoplasm of unspecified behavior of bone, soft tissue, and skin: Secondary | ICD-10-CM

## 2016-11-09 DIAGNOSIS — E6609 Other obesity due to excess calories: Secondary | ICD-10-CM | POA: Diagnosis not present

## 2016-11-09 MED ORDER — LEVOCETIRIZINE DIHYDROCHLORIDE 5 MG PO TABS: 5.0000 mg | ORAL_TABLET | Freq: Every evening | ORAL | 1 refills | Status: DC

## 2016-11-09 NOTE — Patient Instructions (Signed)
Carpal Tunnel Syndrome Carpal tunnel syndrome is a condition that causes pain in your hand and arm. The carpal tunnel is a narrow area located on the palm side of your wrist. Repeated wrist motion or certain diseases may cause swelling within the tunnel. This swelling pinches the main nerve in the wrist (median nerve). What are the causes? This condition may be caused by:  Repeated wrist motions.  Wrist injuries.  Arthritis.  A cyst or tumor in the carpal tunnel.  Fluid buildup during pregnancy.  Sometimes the cause of this condition is not known. What increases the risk? This condition is more likely to develop in:  People who have jobs that cause them to repeatedly move their wrists in the same motion, such as butchers and cashiers.  Women.  People with certain conditions, such as: ? Diabetes. ? Obesity. ? An underactive thyroid (hypothyroidism). ? Kidney failure.  What are the signs or symptoms? Symptoms of this condition include:  A tingling feeling in your fingers, especially in your thumb, index, and middle fingers.  Tingling or numbness in your hand.  An aching feeling in your entire arm, especially when your wrist and elbow are bent for long periods of time.  Wrist pain that goes up your arm to your shoulder.  Pain that goes down into your palm or fingers.  A weak feeling in your hands. You may have trouble grabbing and holding items.  Your symptoms may feel worse during the night. How is this diagnosed? This condition is diagnosed with a medical history and physical exam. You may also have tests, including:  An electromyogram (EMG). This test measures electrical signals sent by your nerves into the muscles.  X-rays.  How is this treated? Treatment for this condition includes:  Lifestyle changes. It is important to stop doing or modify the activity that caused your condition.  Physical or occupational therapy.  Medicines for pain and inflammation.  This may include medicine that is injected into your wrist.  A wrist splint.  Surgery.  Follow these instructions at home: If you have a splint:  Wear it as told by your health care provider. Remove it only as told by your health care provider.  Loosen the splint if your fingers become numb and tingle, or if they turn cold and blue.  Keep the splint clean and dry. General instructions  Take over-the-counter and prescription medicines only as told by your health care provider.  Rest your wrist from any activity that may be causing your pain. If your condition is work related, talk to your employer about changes that can be made, such as getting a wrist pad to use while typing.  If directed, apply ice to the painful area: ? Put ice in a plastic bag. ? Place a towel between your skin and the bag. ? Leave the ice on for 20 minutes, 2-3 times per day.  Keep all follow-up visits as told by your health care provider. This is important.  Do any exercises as told by your health care provider, physical therapist, or occupational therapist. Contact a health care provider if:  You have new symptoms.  Your pain is not controlled with medicines.  Your symptoms get worse. This information is not intended to replace advice given to you by your health care provider. Make sure you discuss any questions you have with your health care provider. Document Released: 12/19/1999 Document Revised: 05/01/2015 Document Reviewed: 05/08/2014 Elsevier Interactive Patient Education  2017 [employer] Inc.  

## 2016-11-09 NOTE — Progress Notes (Signed)
Patient ID: Brooke [name], female    DOB: 1975-08-30, 41 y.o.   MRN: [id number]  Chief Complaint  Patient presents with  . Follow-up  . Sinus Problem    Allergies Ciprofloxacin; Sulfa antibiotics; and Zithromax [azithromycin]  Subjective:   Brooke [name] is a 41 y.o. female who presents to [facility] today.  HPI Here to follow up. Has been doing well. Has lost a few pounds. Has been trying to eat healthier and has quit frying foods. Has been increasing exercise. Does not want to have the flu shot. Is still smoking.  She is not yet ready to quit smoking.  Has had numbness in right and left hands that radiates into wrists and arms. Has worsened over the past couple weeks but has been present for months. Has ordered a brace through insurance but it does not really help. Tends to try and shake hands until feeling comes back in it. History of right wrist fracture as a child.  Denies numbness and tingling in other parts of her body.  Reports that her sinuses are still bothering her.  She has tried Zyrtec, Claritin, Allegra, Benadryl, and most recently Xyzal. Reports that started xyzal and used it for 30 days, it did not help. Still has post nasal drip, itchy watery eyes, sneezing, and itchy throat.Does not like to use any nasal sprays. Has been told by ENT in 2011 that she had chronic sinusitis and needed surgery, but does not want surgery. Has not had allergy testing. Reports that ENT did "look in" sinuses with a scope. No cough or SOB. Xyzal did not help with the drainage at all. Has used benadryl in the past too but they only make her sleepy.  Denies any wheezing or shortness of breath.  Has never had a history of asthma.  Reports that she pain in her left side of abdomen where she felt like her belly was hard, has gotten better. Reports that she went to her chiropractor and had her back adjusted and it is now better. Has had chronic back pain and sees chiropractor for  this.    Hand Pain   The incident occurred more than 1 week ago. There was no injury mechanism. The pain is present in the right hand and left hand. Quality: stinging pain/like pins and needles in hands. Radiates to: radiates to the wrists and arms. The pain is at a severity of 6/10. The pain is moderate. The pain has been intermittent since the incident. Associated symptoms include muscle weakness and tingling. Pertinent negatives include no chest pain. Associated symptoms comments: Feels like grip strength is not as good in hands. Tends to drop things. . Exacerbated by: worse at night when try to sleep. Treatments tried: brace for hands. The treatment provided mild relief.    Past Medical History:  Diagnosis Date  . Disc degeneration, lumbar   . Fibromyalgia   . GERD (gastroesophageal reflux disease)   . Hypertension   . Insomnia   . Knee pain   . Migraines   . Nerve damage   . Neuropathy   . Overweight   . Sinusitis, chronic   . Tendonitis     Past Surgical History:  Procedure Laterality Date  . MOUTH SURGERY     dental surgery /tooth extraction    Family History  Problem Relation Age of Onset  . Hypertension Mother   . Fibromyalgia Mother   . Neuropathy Mother   . Hypertension Father   . Hypertension  Sister   . Fibromyalgia Sister   . Neuropathy Sister      Social History   Socioeconomic History  . Marital status: Single    Spouse name: Not on file  . Number of children: Not on file  . Years of education: Not on file  . Highest education level: Not on file  Social Needs  . Financial resource strain: Not on file  . Food insecurity - worry: Not on file  . Food insecurity - inability: Not on file  . Transportation needs - medical: Not on file  . Transportation needs - non-medical: Not on file  Occupational History  . Not on file  Tobacco Use  . Smoking status: Current Every Day Smoker    Packs/day: 0.50    Years: 24.00    Pack years: 12.00    Types:  Cigarettes  . Smokeless tobacco: Never Used  Substance and Sexual Activity  . Alcohol use: No  . Drug use: No  . Sexual activity: Yes    Birth control/protection: Injection  Other Topics Concern  . Not on file  Social History Narrative   Engaged to get married. Two children, age 23 and 50. They go to school. Does not work outside the house. Lives in [place]. Eats all food groups. Exercises minimally. Has another child that lives in [place] with his father.    Current Outpatient Medications on File Prior to Visit  Medication Sig Dispense Refill  . esomeprazole (NEXIUM) 40 MG capsule Take 1 capsule (40 mg total) by mouth daily at 12 noon. 90 capsule 1  . linaclotide (LINZESS) 145 MCG CAPS capsule Take one a day as needed 30 capsule 0  . medroxyPROGESTERone (DEPO-PROVERA) 150 MG/ML injection Inject 1 mL (150 mg total) into the muscle every 3 (three) months. 1 mL 4  . SUMAtriptan (IMITREX) 100 MG tablet Take 1 tablet (100 mg total) by mouth every 2 (two) hours as needed for migraine. May repeat in 2 hours if headache persists or recurs. 9 tablet 3  . levocetirizine (XYZAL) 5 MG tablet Take 1 tablet (5 mg total) by mouth every evening. (Patient not taking: Reported on 11/09/2016) 90 tablet 1   No current facility-administered medications on file prior to visit.     Review of Systems  Cardiovascular: Negative for chest pain.  Neurological: Positive for tingling.     Objective:   BP 126/74 (BP Location: Left Arm, Patient Position: Sitting, Cuff Size: Normal)   Pulse 76   Temp 98.4 F (36.9 C) (Other (Comment))   Resp 16   Ht 5\' 7"  (1.702 m)   Wt 251 lb 12 oz (114.2 kg)   SpO2 98%   BMI 39.43 kg/m   Physical Exam  Constitutional: She is oriented to person, place, and time. She appears well-developed and well-nourished. No distress.  HENT:  Head: Normocephalic and atraumatic.  Mouth/Throat: Oropharynx is clear and moist and mucous membranes are normal. Abnormal dentition. No  oropharyngeal exudate. No tonsillar exudate.  Dentition in poor repair.  Patient is seeing multiple teeth.  Evidence of postnasal drip down posterior pharynx.  Eyes: Pupils are equal, round, and reactive to light. Right eye exhibits no discharge. Left eye exhibits no discharge. No scleral icterus.  Neck: Normal range of motion. Neck supple. No JVD present. No tracheal deviation present. No thyromegaly present.  Cardiovascular: Normal rate, regular rhythm and normal heart sounds.  Pulmonary/Chest: Effort normal and breath sounds normal. No respiratory distress.  Abdominal: Soft. She exhibits no distension.  There is no tenderness.  Lymphadenopathy:    She has no cervical adenopathy.  Neurological: She is alert and oriented to person, place, and time. She has normal strength. She displays no atrophy and no tremor. A sensory deficit is present. No cranial nerve deficit. She exhibits normal muscle tone. Coordination normal.  Sensation decreased in right hand greater than left. Grip strength strong bilaterally.   Skin: Skin is warm and dry. Capillary refill takes less than 2 seconds.  Psychiatric: She has a normal mood and affect. Her behavior is normal. Judgment and thought content normal.  Nursing note and vitals reviewed.    Assessment and Plan   1. Bilateral carpal tunnel syndrome Patient not interested in trying NSAIDs for carpal tunnel syndrome.  She was given prescription for cockup wrist splints.  Referral to orthopedic was placed.  She was told she would likely need nerve conduction studies for evaluation. - DME Other see comment - Ambulatory referral to Orthopedic Surgery  2. Seasonal allergic rhinitis due to pollen Patient refuses to take all nasal sprays.  She has been tried on all available antihistamines.  Reportedly told she needed sinus surgery in the past but has not wanted to go this route.  Is not interested in checking blood allergy testing.  I discussed with patient that at  this point other than doing a referral to ENT I was not sure of another way to control her symptoms.  Tobacco cessation was recommended.  Referral placed to ENT. - levocetirizine (XYZAL) 5 MG tablet; Take 1 tablet (5 mg total) every evening by mouth.  Dispense: 90 tablet; Refill: 1  3. Skin neoplasm During exam lesion on her nose was observed.  I discussed with patient that despite the area only being there for several weeks, it was concerning due to its appearance and size.  Referral to dermatology for evaluation and biopsy is recommended.  Patient has obvious photodamage of the skin. - Ambulatory referral to Dermatology  4. Chronic sinusitis, unspecified location See above.  5. Environmental allergies See above. - Ambulatory referral to ENT  6. Hyperlipidemia, unspecified hyperlipidemia type Patient will return to clinic to get her lipid panel performed when she is fasting. - Lipid panel  7. Obesity due to excess calories without serious comorbidity, unspecified classification Patient has lost 3 pounds since her last visit.  We discussed her body mass index today and her need for weight loss.  She was counseled regarding diet and exercise.  She reports she will try to make attempts to be more active and continue to improve her diet.  She was encouraged to follow-up with her GYN for her routine gynecologic evaluation. Patient again defers flu shot. No Follow-up on file. Caren Macadam, MD 11/09/2016

## 2016-11-15 ENCOUNTER — Encounter: Payer: Self-pay | Admitting: Family Medicine

## 2016-11-18 MED ORDER — AMOXICILLIN-POT CLAVULANATE 875-125 MG PO TABS
1.0000 | ORAL_TABLET | Freq: Two times a day (BID) | ORAL | 0 refills | Status: DC
Start: 2016-11-18 — End: 2017-01-13

## 2016-11-18 NOTE — Telephone Encounter (Signed)
Please call and advise that I did send in the medication. I normally would not do this but we had discussed at her visit. Advise her that if she is not feeling better or worsens that she needs to follow up.

## 2017-01-13 ENCOUNTER — Ambulatory Visit: Payer: Medicare Other

## 2017-01-13 ENCOUNTER — Encounter: Payer: Self-pay | Admitting: *Deleted

## 2017-01-13 ENCOUNTER — Ambulatory Visit (INDEPENDENT_AMBULATORY_CARE_PROVIDER_SITE_OTHER): Payer: Medicare Other | Admitting: *Deleted

## 2017-01-13 DIAGNOSIS — Z3202 Encounter for pregnancy test, result negative: Secondary | ICD-10-CM | POA: Diagnosis not present

## 2017-01-13 DIAGNOSIS — Z308 Encounter for other contraceptive management: Secondary | ICD-10-CM

## 2017-01-13 DIAGNOSIS — Z3042 Encounter for surveillance of injectable contraceptive: Secondary | ICD-10-CM | POA: Diagnosis not present

## 2017-01-13 LAB — POCT URINE PREGNANCY: Preg Test, Ur: NEGATIVE

## 2017-01-13 MED ORDER — MEDROXYPROGESTERONE ACETATE 150 MG/ML IM SUSP
150.0000 mg | Freq: Once | INTRAMUSCULAR | Status: AC
  Administered 2017-01-13: 150 mg via INTRAMUSCULAR

## 2017-01-13 NOTE — Progress Notes (Signed)
Pt here for Depo. Pt tolerated shot well. Return in 12 weeks for next shot. JSY 

## 2017-02-08 ENCOUNTER — Ambulatory Visit: Payer: Medicare Other | Admitting: Family Medicine

## 2017-02-09 ENCOUNTER — Ambulatory Visit: Payer: Medicare Other | Admitting: Family Medicine

## 2017-02-25 ENCOUNTER — Ambulatory Visit (INDEPENDENT_AMBULATORY_CARE_PROVIDER_SITE_OTHER): Payer: Medicare Other | Admitting: Family Medicine

## 2017-02-25 ENCOUNTER — Encounter: Payer: Self-pay | Admitting: Family Medicine

## 2017-02-25 ENCOUNTER — Other Ambulatory Visit: Payer: Self-pay

## 2017-02-25 VITALS — BP 130/80 | HR 103 | Temp 98.1°F | Resp 16 | Ht 67.0 in | Wt 256.8 lb

## 2017-02-25 DIAGNOSIS — J309 Allergic rhinitis, unspecified: Secondary | ICD-10-CM | POA: Diagnosis not present

## 2017-02-25 DIAGNOSIS — R252 Cramp and spasm: Secondary | ICD-10-CM

## 2017-02-25 DIAGNOSIS — E785 Hyperlipidemia, unspecified: Secondary | ICD-10-CM

## 2017-02-25 DIAGNOSIS — R1013 Epigastric pain: Secondary | ICD-10-CM | POA: Diagnosis not present

## 2017-02-25 DIAGNOSIS — Z720 Tobacco use: Secondary | ICD-10-CM | POA: Diagnosis not present

## 2017-02-25 DIAGNOSIS — K219 Gastro-esophageal reflux disease without esophagitis: Secondary | ICD-10-CM | POA: Diagnosis not present

## 2017-02-25 DIAGNOSIS — R946 Abnormal results of thyroid function studies: Secondary | ICD-10-CM | POA: Insufficient documentation

## 2017-02-25 DIAGNOSIS — Z23 Encounter for immunization: Secondary | ICD-10-CM

## 2017-02-25 MED ORDER — MOMETASONE FUROATE 50 MCG/ACT NA SUSP: 2.0000 | Freq: Every day | NASAL | 12 refills | Status: DC

## 2017-02-25 MED ORDER — LEVOCETIRIZINE DIHYDROCHLORIDE 5 MG PO TABS: 5.0000 mg | ORAL_TABLET | Freq: Every evening | ORAL | 3 refills | Status: DC

## 2017-02-25 MED ORDER — ESOMEPRAZOLE MAGNESIUM 40 MG PO CPDR: 40.0000 mg | DELAYED_RELEASE_CAPSULE | Freq: Every day | ORAL | 1 refills | Status: DC

## 2017-02-25 NOTE — Progress Notes (Signed)
Patient ID: Brooke [name], female    DOB: 05/26/1975, 42 y.o.   MRN: [id number]  Chief Complaint  Patient presents with  . Follow-up    Allergies Ciprofloxacin; Sulfa antibiotics; and Zithromax [azithromycin]  Subjective:   Brooke [name] is a 42 y.o. female who presents to [facility] today.  HPI Brooke [name] presents today for follow-up visit and refill of her medications.  She reports that she has a history of has chronic sinusitis and has been seen by ENT in the past.  She reports that they wanted to do surgery but she has refused surgery.Marland Kitchen Has been eating Flonase for years and believe she has become immune to it.  Reports that it does not seem to be helping with her nasal congestion and postnasal drip.  Reports that she always is draining mucus down the back of throat.  Symptoms are worse in the morning when she wakes up.  No pain in sinus. No nasal or facial pain. Has been treated for a sinus infection last time that she was seen in the office and it is better. Used zytrec for 2 years, and reports it helped initially but then it stopped.  Has never taken antihistamines and used nasal steroids at the same time.  She reports that her migraines are under much better control at this time.  Reports that she is still using the imitrex 3-4 times a month.  She reports that her headaches used to be daily does not have problems now since teeth were pulled. Headaches decreased a lot.  She reports her migraines are unchanged otherwise.  Has been on nexium for over 5 years. Was scoped by ENT and put on nexium.  She reports that if does not take the nexium gets bad heartburn.  Reports that she can go 2 days without taking the medication but then if she does not take it that she gets symptoms of bad heartburn.  She denies any nausea, vomiting, difficulty with swallowing.  She reports that she does need a refill on the Nexium because the heartburn is terrible without it.  She denies any  problems with abdominal pain.  She has been on linzess for years. Smokes cigarettes since age 39 years, 1 ppd.  She reports that she used to smoke 3-4 ppd.  If does not take the PPI daily then has belching, burping, heartburn. Gets reflux of food back into mouth at times.  Denies any melena, bright red blood per rectum, or hematochezia.  She is not interested in quitting smoking at this time and is pleased that she has cut down on her cigarette smoking. She reports that she has been getting some occasional leg cramps.  She reports that in her left leg that she has had cramps at night that have bothered her.  She reports that this does happen from time to time.  She reports this is different from the pain she experiences with her fibromyalgia.  She reports the pain with the leg cramps is sharp, intermittent, and does not radiate. She reports that she has had a history of high cholesterol in the past.  Reports that her previous physician was going to put her on some medications but she does not start them.  Her cholesterol was not drawn when she got her blood work last time.  She does not want a flu shot today.  She does have a history of borderline thyroid function tests.  She reports that it was checked because she has been  constipated for a long time.  She denies any prior history of being on any thyroid medication.  She reports her energy is good for the most part unless her fibromyalgia is acting up.  She reports her mood is good.  Does not feel down, sad, or hopeless.  It has not had any weight loss.    Past Medical History:  Diagnosis Date  . Disc degeneration, lumbar   . Fibromyalgia   . GERD (gastroesophageal reflux disease)   . Hypertension   . Insomnia   . Knee pain   . Migraines   . Nerve damage   . Neuropathy   . Overweight   . Sinusitis, chronic   . Tendonitis     Past Surgical History:  Procedure Laterality Date  . MOUTH SURGERY     dental surgery /tooth extraction     Family History  Problem Relation Age of Onset  . Hypertension Mother   . Fibromyalgia Mother   . Neuropathy Mother   . Hypertension Father   . Hypertension Sister   . Fibromyalgia Sister   . Neuropathy Sister      Social History   Socioeconomic History  . Marital status: Single    Spouse name: None  . Number of children: None  . Years of education: None  . Highest education level: None  Social Needs  . Financial resource strain: None  . Food insecurity - worry: None  . Food insecurity - inability: None  . Transportation needs - medical: None  . Transportation needs - non-medical: None  Occupational History  . None  Tobacco Use  . Smoking status: Current Every Day Smoker    Packs/day: 0.50    Years: 24.00    Pack years: 12.00    Types: Cigarettes  . Smokeless tobacco: Never Used  Substance and Sexual Activity  . Alcohol use: No  . Drug use: No  . Sexual activity: Yes    Birth control/protection: Injection  Other Topics Concern  . None  Social History Narrative   Engaged to get married. Two children, age 74 and 65. They go to school. Does not work outside the house. Lives in [place]. Eats all food groups. Exercises minimally. Has another child that lives in [place] with his father.    Current Outpatient Medications on File Prior to Visit  Medication Sig Dispense Refill  . levocetirizine (XYZAL) 5 MG tablet Take 1 tablet (5 mg total) every evening by mouth. 90 tablet 1  . linaclotide (LINZESS) 145 MCG CAPS capsule Take one a day as needed 30 capsule 0  . medroxyPROGESTERone (DEPO-PROVERA) 150 MG/ML injection Inject 1 mL (150 mg total) into the muscle every 3 (three) months. 1 mL 4  . SUMAtriptan (IMITREX) 100 MG tablet Take 1 tablet (100 mg total) by mouth every 2 (two) hours as needed for migraine. May repeat in 2 hours if headache persists or recurs. 9 tablet 3   No current facility-administered medications on file prior to visit.     Review of  Systems   Objective:   BP 130/80 (BP Location: Left Arm, Patient Position: Sitting, Cuff Size: Normal)   Pulse (!) 103   Temp 98.1 F (36.7 C) (Temporal)   Resp 16   Ht 5\' 7"  (1.702 m)   Wt 256 lb 12 oz (116.5 kg)   SpO2 97%   BMI 40.21 kg/m   Physical Exam  Constitutional: She is oriented to person, place, and time. She appears well-developed and well-nourished. No distress.  HENT:  Head: Normocephalic and atraumatic.  Nose: Mucosal edema present. No sinus tenderness, nasal deformity or nasal septal hematoma. No epistaxis.  No foreign bodies. Right sinus exhibits no maxillary sinus tenderness and no frontal sinus tenderness. Left sinus exhibits no maxillary sinus tenderness and no frontal sinus tenderness.  Mouth/Throat: Uvula is midline and oropharynx is clear and moist. No oral lesions. Abnormal dentition. No uvula swelling. No oropharyngeal exudate.  Bilateral turbinate hyperemia and mild edema. No dentures in place.  Patient with almost all of her teeth missing.  Eyes: Pupils are equal, round, and reactive to light.  Neck: Normal range of motion. Neck supple. No thyromegaly present.  Cardiovascular: Normal rate, regular rhythm and normal heart sounds.  Pulmonary/Chest: Effort normal and breath sounds normal. No respiratory distress.  Abdominal: Soft. Bowel sounds are normal.  Central abdominal obesity present.  Musculoskeletal: Normal range of motion. She exhibits no edema.  No calf tenderness bilaterally.  No edema in lower extremities bilaterally.  No muscle tenderness to palpation.  Neurological: She is alert and oriented to person, place, and time. No cranial nerve deficit.  Skin: Skin is warm and dry.  Psychiatric: She has a normal mood and affect. Her behavior is normal. Judgment and thought content normal.  Nursing note and vitals reviewed.    Assessment and Plan  1. Dyspepsia I did refill patient's PPI today.  However, I did discuss with patient at length that I  do believe she needs to have an upper endoscopy performed.  We discussed that due to the severity of her symptoms, the fact that she cannot be off a PPI for more than 2 days without experiencing these symptoms, and with her long history of use of this medication that she definitely needs to have an endoscopy performed.  She is agreeable to seeing the gastroenterologist.  Reflux precautions were discussed.  We did discuss that weight loss and smoking cessation would likely aid in her problem.  However, we do need to study to make sure there is no esophageal or gastric pathology that is causing her symptoms. - Ambulatory referral to Gastroenterology - esomeprazole (NEXIUM) 40 MG capsule; Take 1 capsule (40 mg total) by mouth daily at 12 noon.  Dispense: 90 capsule; Refill: 1  2. Allergic rhinitis, unspecified seasonality, unspecified trigger Discussed with patient I would like to try concurrent use of nasal steroid and antihistamine.  She is agreeable to this.  I did discuss that after this treatment if she is no longer improved then I would like for her to follow-up with ENT. - mometasone (NASONEX) 50 MCG/ACT nasal spray; Place 2 sprays into the nose daily.  Dispense: 17 g; Refill: 12 - levocetirizine (XYZAL) 5 MG tablet; Take 1 tablet (5 mg total) by mouth every evening.  Dispense: 30 tablet; Refill: 3  3. Gastroesophageal reflux disease without esophagitis Medication refilled - esomeprazole (NEXIUM) 40 MG capsule; Take 1 capsule (40 mg total) by mouth daily at 12 noon.  Dispense: 90 capsule; Refill: 1  4. Leg cramp Discussed with patient that her leg cramps could be secondary to the fact that she does not drink any water.  She has cut down from a 12 pack of sodas a day to about 6 sodas a day.  I did ask her to please continue to cut down on her sodas and increase her water intake.  We will check her labs at this time.  Dietary improvement, weight loss, and exercise was recommended. - Magnesium -  Basic metabolic  panel - VITAMIN D 25 Hydroxy (Vit-D Deficiency, Fractures) - Vitamin B12  5. Borderline abnormal thyroid function test Recheck levels at this time. - TSH  6. Hyperlipidemia, unspecified hyperlipidemia type Check cholesterol levels due to her history and her obesity. - Lipid panel  7. Immunization due Defers flu shot today. - Flu Vaccine QUAD 6+ mos PF IM (Fluarix Quad PF)  8. Tobacco abuse The 5 A's Model for treating Tobacco Use and Dependence was used today. I have identified and documented tobacco use status for this patient. I have urged the patient to quit tobacco use. At this time, the patient is unwilling and not ready to attempt to quit. I have provided patient with information regarding risks, cessation techniques, and interventions that might increase future attempts to quit smoking. I will plan on again addressing tobacco dependence at the next visit. We did discuss even though she is not willing to quit smoking at this time that I would like for her to continue to decrease her cigarette intake.  She was congratulated on the fact that she is now down to 1 pack/day.  Return in about 3 months (around 05/25/2017) for follow up. Caren Macadam, MD 02/25/2017

## 2017-02-26 LAB — BASIC METABOLIC PANEL
BUN: 10 mg/dL (ref 7–25)
CO2: 24 mmol/L (ref 20–32)
CREATININE: 0.98 mg/dL (ref 0.50–1.10)
Calcium: 9 mg/dL (ref 8.6–10.2)
Chloride: 108 mmol/L (ref 98–110)
Glucose, Bld: 81 mg/dL (ref 65–99)
Potassium: 4.3 mmol/L (ref 3.5–5.3)
Sodium: 137 mmol/L (ref 135–146)

## 2017-02-26 LAB — LIPID PANEL
CHOL/HDL RATIO: 6 (calc) — AB (ref <5.0)
CHOLESTEROL: 174 mg/dL (ref <200)
HDL: 29 mg/dL — ABNORMAL LOW (ref >50)
LDL Cholesterol (Calc): 119 mg/dL (calc) — ABNORMAL HIGH
NON-HDL CHOLESTEROL (CALC): 145 mg/dL — AB (ref <130)
Triglycerides: 147 mg/dL (ref <150)

## 2017-02-26 LAB — VITAMIN D 25 HYDROXY (VIT D DEFICIENCY, FRACTURES): VIT D 25 HYDROXY: 19 ng/mL — AB (ref 30–100)

## 2017-02-26 LAB — TSH: TSH: 3.2 m[IU]/L

## 2017-02-26 LAB — MAGNESIUM: MAGNESIUM: 2.1 mg/dL (ref 1.5–2.5)

## 2017-02-26 LAB — VITAMIN B12: Vitamin B-12: 191 pg/mL — ABNORMAL LOW (ref 200–1100)

## 2017-03-01 ENCOUNTER — Encounter: Payer: Self-pay | Admitting: Family Medicine

## 2017-03-01 MED ORDER — VITAMIN D (ERGOCALCIFEROL) 1.25 MG (50000 UNIT) PO CAPS: 50000.0000 [IU] | ORAL_CAPSULE | ORAL | 1 refills | Status: DC

## 2017-03-01 NOTE — Telephone Encounter (Signed)
Please call and advise that her B12 levels are deficient and she needs to come in for a B12 shot. We do have those correct? In addition, advise her vitamin D was deficient and I sent in a prescription. She will take one pill a week for the next 3 months and then we will recheck levels.  We can discuss labs at her follow up. She can do nurse visit for shot this week and then follow up OV with me in 2 weeks and get another shot at that visit.   Please advise. Gwen Her. [name], MD

## 2017-03-02 ENCOUNTER — Telehealth: Payer: Self-pay | Admitting: Family Medicine

## 2017-03-02 ENCOUNTER — Ambulatory Visit (INDEPENDENT_AMBULATORY_CARE_PROVIDER_SITE_OTHER): Payer: Medicare Other

## 2017-03-02 ENCOUNTER — Encounter: Payer: Self-pay | Admitting: Family Medicine

## 2017-03-02 DIAGNOSIS — E538 Deficiency of other specified B group vitamins: Secondary | ICD-10-CM

## 2017-03-02 MED ORDER — VITAMIN D (ERGOCALCIFEROL) 1.25 MG (50000 UNIT) PO CAPS: 50000.0000 [IU] | ORAL_CAPSULE | ORAL | 1 refills | Status: DC

## 2017-03-02 MED ORDER — CYANOCOBALAMIN 1000 MCG/ML IJ SOLN
1000.0000 ug | Freq: Once | INTRAMUSCULAR | Status: AC
  Administered 2017-03-02: 1000 ug via INTRAMUSCULAR

## 2017-03-02 NOTE — Telephone Encounter (Signed)
Please advised that the B12 is over-the-counter.  B12 1000 mcg p.o. daily.  This is not a prescription.

## 2017-03-02 NOTE — Telephone Encounter (Signed)
Labs ordered per Dr.Hagler 

## 2017-03-05 LAB — METHYLMALONIC ACID, SERUM: METHYLMALONIC ACID, QUANT: 169 nmol/L (ref 87–318)

## 2017-03-07 ENCOUNTER — Encounter: Payer: Self-pay | Admitting: Internal Medicine

## 2017-03-16 ENCOUNTER — Other Ambulatory Visit: Payer: Self-pay | Admitting: Family Medicine

## 2017-03-16 ENCOUNTER — Ambulatory Visit: Payer: Medicare Other

## 2017-03-16 DIAGNOSIS — E538 Deficiency of other specified B group vitamins: Secondary | ICD-10-CM

## 2017-03-16 MED ORDER — CYANOCOBALAMIN 1000 MCG/ML IJ SOLN
1000.0000 ug | Freq: Once | INTRAMUSCULAR | Status: AC
  Administered 2017-04-27: 1000 ug via INTRAMUSCULAR

## 2017-03-17 LAB — LIPID PANEL
Cholesterol: 188 mg/dL (ref <200)
HDL: 27 mg/dL — AB (ref >50)
LDL CHOLESTEROL (CALC): 128 mg/dL — AB
NON-HDL CHOLESTEROL (CALC): 161 mg/dL — AB (ref <130)
TRIGLYCERIDES: 189 mg/dL — AB (ref <150)
Total CHOL/HDL Ratio: 7 (calc) — ABNORMAL HIGH (ref <5.0)

## 2017-03-17 LAB — BASIC METABOLIC PANEL
BUN: 8 mg/dL (ref 7–25)
CALCIUM: 9 mg/dL (ref 8.6–10.2)
CHLORIDE: 107 mmol/L (ref 98–110)
CO2: 25 mmol/L (ref 20–32)
Creat: 0.93 mg/dL (ref 0.50–1.10)
Glucose, Bld: 80 mg/dL (ref 65–99)
Potassium: 4.2 mmol/L (ref 3.5–5.3)
SODIUM: 137 mmol/L (ref 135–146)

## 2017-03-17 LAB — VITAMIN D 25 HYDROXY (VIT D DEFICIENCY, FRACTURES): VIT D 25 HYDROXY: 22 ng/mL — AB (ref 30–100)

## 2017-03-17 LAB — TSH: TSH: 3.07 m[IU]/L

## 2017-03-17 LAB — MAGNESIUM: Magnesium: 2.2 mg/dL (ref 1.5–2.5)

## 2017-03-18 ENCOUNTER — Encounter: Payer: Self-pay | Admitting: Family Medicine

## 2017-03-28 ENCOUNTER — Encounter: Payer: Self-pay | Admitting: Family Medicine

## 2017-03-29 ENCOUNTER — Encounter (HOSPITAL_COMMUNITY): Payer: Self-pay | Admitting: Family Medicine

## 2017-03-29 ENCOUNTER — Ambulatory Visit (HOSPITAL_COMMUNITY)
Admission: EM | Admit: 2017-03-29 | Discharge: 2017-03-29 | Disposition: A | Discharge status: Home / Self Care | Payer: Medicare Other | Attending: Family Medicine | Admitting: Family Medicine

## 2017-03-29 DIAGNOSIS — R21 Rash and other nonspecific skin eruption: Secondary | ICD-10-CM | POA: Diagnosis not present

## 2017-03-29 MED ORDER — PREDNISONE 50 MG PO TABS: 50.0000 mg | ORAL_TABLET | Freq: Every day | ORAL | 0 refills | Status: AC

## 2017-03-29 MED ORDER — OLOPATADINE HCL 0.1 % OP SOLN: 1.0000 [drp] | Freq: Two times a day (BID) | OPHTHALMIC | 0 refills | Status: AC

## 2017-03-29 NOTE — Telephone Encounter (Signed)
I have LVM for the pt to call us back to schedule

## 2017-03-29 NOTE — ED Provider Notes (Signed)
Jolly    CSN: [id number] Arrival date & time: 03/29/17  1142     History   Chief Complaint Chief Complaint  Patient presents with  . Rash    HPI Brooke [name] is a 42 y.o. female history of hypertension, GERD presenting today with a rash to her face.  She states that Sunday morning she woke up with eye redness, itching, drainage and irritation.  She used over-the-counter eyedrops from [facility] for pinkeye relief.  Yesterday when she woke up the redness and itching had decreased in her eyes, she used artificial tears but also began to notice a rash on her nose as well as underneath her eyes.  She tried Benadryl cream which worsened burning and pain.  Today when she woke up she felt like her rash had worsened, became thicker, more itching and had spread to her chin.  Denies spreading anywhere else than her face.  Denies mouth lesions.  Denies changes in vision.  Patient does not wear glasses or contacts.  Cannot relate any new soaps, detergents, lotions, makeup other than the new eyedrops. HPI  Past Medical History:  Diagnosis Date  . Disc degeneration, lumbar   . Fibromyalgia   . GERD (gastroesophageal reflux disease)   . Hypertension   . Insomnia   . Knee pain   . Migraines   . Nerve damage   . Neuropathy   . Overweight   . Sinusitis, chronic   . Tendonitis     Patient Active Problem List   Diagnosis Date Noted  . Hyperlipidemia 02/25/2017  . Borderline abnormal thyroid function test 02/25/2017  . Leg cramp 02/25/2017  . Migraines 09/30/2016  . Migraine without aura and responsive to treatment 09/30/2016  . Tired 05/06/2016  . Well woman exam with routine gynecological exam 05/06/2016  . Obesity 05/05/2015  . Smoker 05/05/2015  . Elevated BP 05/05/2015  . Neck pain 03/06/2015    Past Surgical History:  Procedure Laterality Date  . MOUTH SURGERY     dental surgery /tooth extraction    OB History    Gravida  3   Para  3   Term  3   Preterm      AB      Living  3     SAB      TAB      Ectopic      Multiple      Live Births  3            Home Medications    Prior to Admission medications   Medication Sig Start Date End Date Taking? Authorizing Provider  esomeprazole (NEXIUM) 40 MG capsule Take 1 capsule (40 mg total) by mouth daily at 12 noon. 02/25/17   Caren Macadam, MD  levocetirizine (XYZAL) 5 MG tablet Take 1 tablet (5 mg total) every evening by mouth. 11/09/16   Caren Macadam, MD  levocetirizine (XYZAL) 5 MG tablet Take 1 tablet (5 mg total) by mouth every evening. 02/25/17   Caren Macadam, MD  linaclotide [facility]) 145 MCG CAPS capsule Take one a day as needed 09/30/16   Caren Macadam, MD  medroxyPROGESTERone (DEPO-PROVERA) 150 MG/ML injection Inject 1 mL (150 mg total) into the muscle every 3 (three) months. 05/06/16   Estill Dooms, NP  mometasone (NASONEX) 50 MCG/ACT nasal spray Place 2 sprays into the nose daily. 02/25/17   Caren Macadam, MD  olopatadine (PATANOL) 0.1 % ophthalmic solution Place 1 drop into both eyes 2 (two) times  daily for 7 days. 03/29/17 04/05/17  Wieters, Hallie C, PA-C  predniSONE (DELTASONE) 50 MG tablet Take 1 tablet (50 mg total) by mouth daily for 5 days. 03/29/17 04/03/17  Wieters, Hallie C, PA-C  SUMAtriptan (IMITREX) 100 MG tablet Take 1 tablet (100 mg total) by mouth every 2 (two) hours as needed for migraine. May repeat in 2 hours if headache persists or recurs. 09/30/16   Caren Macadam, MD  Vitamin D, Ergocalciferol, (DRISDOL) 50000 units CAPS capsule Take 1 capsule (50,000 Units total) by mouth every 7 (seven) days. 03/02/17   Caren Macadam, MD    Family History Family History  Problem Relation Age of Onset  . Hypertension Mother   . Fibromyalgia Mother   . Neuropathy Mother   . Hypertension Father   . Hypertension Sister   . Fibromyalgia Sister   . Neuropathy Sister     Social History Social History   Tobacco Use  . Smoking status: Current Every  Day Smoker    Packs/day: 0.50    Years: 24.00    Pack years: 12.00    Types: Cigarettes  . Smokeless tobacco: Never Used  Substance Use Topics  . Alcohol use: No  . Drug use: No     Allergies   Ciprofloxacin; Sulfa antibiotics; and Zithromax [azithromycin]   Review of Systems Review of Systems  Constitutional: Negative for fatigue and fever.  HENT: Negative for congestion and sore throat.   Eyes: Positive for redness and itching. Negative for photophobia, pain, discharge and visual disturbance.  Respiratory: Negative for cough and shortness of breath.   Cardiovascular: Negative for chest pain.  Gastrointestinal: Negative for nausea and vomiting.  Musculoskeletal: Negative for arthralgias and myalgias.  Skin: Positive for color change and rash.  Neurological: Negative for dizziness, weakness, light-headedness, numbness and headaches.     Physical Exam Triage Vital Signs ED Triage Vitals [03/29/17 1247]  Enc Vitals Group     BP (!) 156/87     Pulse Rate 89     Resp 18     Temp 98.1 F (36.7 C)     Temp src      SpO2 98 %     Weight      Height      Head Circumference      Peak Flow      Pain Score 0     Pain Loc      Pain Edu?      Excl. in [place]?    No data found.  Updated Vital Signs BP (!) 156/87   Pulse 89   Temp 98.1 F (36.7 C)   Resp 18   SpO2 98%   Visual Acuity Right Eye Distance:   Left Eye Distance:   Bilateral Distance:    Right Eye Near:   Left Eye Near:    Bilateral Near:     Physical Exam  Constitutional: She is oriented to person, place, and time. She appears well-developed and well-nourished. No distress.  HENT:  Head: Normocephalic and atraumatic.  Mouth/Throat: Oropharynx is clear and moist.  No lesions observed in mouth  Eyes: Pupils are equal, round, and reactive to light. Conjunctivae and EOM are normal.  Conjunctiva nonerythematous, no discharge or drainage observed  Neck: Neck supple.  Cardiovascular: Normal rate.    Pulmonary/Chest: Effort normal. No respiratory distress.  Musculoskeletal: She exhibits no edema.  Neurological: She is alert and oriented to person, place, and time. No cranial nerve deficit.  Skin: Skin is warm and  dry. Rash noted.  Erythematous rash below bilateral orbits, across nasal bridge extending onto maxilla on right side.  Erythema to chin.  Minimally increased warmth.  See pictures below  Psychiatric: She has a normal mood and affect.  Nursing note and vitals reviewed.        UC Treatments / Results  Labs (all labs ordered are listed, but only abnormal results are displayed) Labs Reviewed - No data to display  EKG None Radiology No results found.  Procedures Procedures (including critical care time)  Medications Ordered in UC Medications - No data to display   Initial Impression / Assessment and Plan / UC Course  I have reviewed the triage vital signs and the nursing notes.  Pertinent labs & imaging results that were available during my care of the patient were reviewed by me and considered in my medical decision making (see chart for details).     Rash appears more allergic/contact dermatitis versus a cellulitis.  Recommend Benadryl and famotidine to control itching.  Prednisone times 5 days.  Avoiding cream as this is on the face and close to the eyes.  Advised to return if rash spreading, worsening, develops changes in vision, eye pain.  Will treat eye itching as viral/allergic conjunctivitis with olopatadine. Discussed strict return precautions. Patient verbalized understanding and is agreeable with plan.   Final Clinical Impressions(s) / UC Diagnoses   Final diagnoses:  Rash and nonspecific skin eruption    ED Discharge Orders        Ordered    predniSONE (DELTASONE) 50 MG tablet  Daily     03/29/17 1309    olopatadine (PATANOL) 0.1 % ophthalmic solution  2 times daily     03/29/17 1309       Controlled Substance Prescriptions [place] Controlled  Substance Registry consulted? Not Applicable   Janith Lima, [place] 03/29/17 1324

## 2017-03-29 NOTE — Discharge Instructions (Signed)
Please begin prednisone daily for the next 5 days.  Please use Benadryl paired with famotidine/Pepcid.  These will help with itching.  Please use olopatadine eyedrops in each eye daily to help with itching and irritation.  Please return if symptoms not improving with treatment.  Please return sooner if symptoms worsening, developing spreading rash, changes in vision, eye pain, fever.

## 2017-03-29 NOTE — ED Triage Notes (Signed)
Pt here for itchy rash to face since Sunday. Reports that it started with eye redness and itching and has now spread to face. Denies any new lotions, soap, or allergic contacts. She has used benadryl cream, artificial tears and pink eye relief OTC.

## 2017-03-29 NOTE — Telephone Encounter (Signed)
Please call patient and advise that she needs to be seen and evaluated at our office or at the eye doctor if she has one Brooke Her. [name], MD

## 2017-04-07 ENCOUNTER — Encounter: Payer: Self-pay | Admitting: *Deleted

## 2017-04-07 ENCOUNTER — Ambulatory Visit (INDEPENDENT_AMBULATORY_CARE_PROVIDER_SITE_OTHER): Payer: Medicare Other | Admitting: *Deleted

## 2017-04-07 DIAGNOSIS — Z3202 Encounter for pregnancy test, result negative: Secondary | ICD-10-CM

## 2017-04-07 DIAGNOSIS — Z308 Encounter for other contraceptive management: Secondary | ICD-10-CM

## 2017-04-07 DIAGNOSIS — Z3042 Encounter for surveillance of injectable contraceptive: Secondary | ICD-10-CM

## 2017-04-07 LAB — POCT URINE PREGNANCY: PREG TEST UR: NEGATIVE

## 2017-04-07 MED ORDER — MEDROXYPROGESTERONE ACETATE 150 MG/ML IM SUSP
150.0000 mg | Freq: Once | INTRAMUSCULAR | Status: AC
  Administered 2017-04-07: 150 mg via INTRAMUSCULAR

## 2017-04-07 NOTE — Progress Notes (Signed)
Pt here for Depo. Pt tolerated shot well. Return in 12 weeks for next shot. JSY 

## 2017-04-26 ENCOUNTER — Ambulatory Visit: Payer: Medicare Other | Admitting: Gastroenterology

## 2017-04-27 ENCOUNTER — Other Ambulatory Visit: Payer: Self-pay

## 2017-04-27 ENCOUNTER — Encounter: Payer: Self-pay | Admitting: Family Medicine

## 2017-04-27 ENCOUNTER — Ambulatory Visit (INDEPENDENT_AMBULATORY_CARE_PROVIDER_SITE_OTHER): Payer: Medicare Other | Admitting: Family Medicine

## 2017-04-27 VITALS — BP 130/80 | HR 74 | Temp 98.0°F | Resp 16 | Ht 67.0 in | Wt 253.0 lb

## 2017-04-27 DIAGNOSIS — E559 Vitamin D deficiency, unspecified: Secondary | ICD-10-CM

## 2017-04-27 DIAGNOSIS — Z6834 Body mass index (BMI) 34.0-34.9, adult: Secondary | ICD-10-CM | POA: Diagnosis not present

## 2017-04-27 DIAGNOSIS — J320 Chronic maxillary sinusitis: Secondary | ICD-10-CM | POA: Diagnosis not present

## 2017-04-27 DIAGNOSIS — Z720 Tobacco use: Secondary | ICD-10-CM | POA: Diagnosis not present

## 2017-04-27 DIAGNOSIS — E538 Deficiency of other specified B group vitamins: Secondary | ICD-10-CM

## 2017-04-27 DIAGNOSIS — E782 Mixed hyperlipidemia: Secondary | ICD-10-CM

## 2017-04-27 DIAGNOSIS — E6609 Other obesity due to excess calories: Secondary | ICD-10-CM

## 2017-04-27 NOTE — Patient Instructions (Addendum)
Follow up in 3-4 months Get your labs done 1-2 days before your visit. You do not need to be fasting for blood work Continue your B12 each day.  You need to cut down on sodas and smoking.  You will work towards loosing 13 pounds before you come back.  Do this by exercise and cutting down on sugar!! ENT referral placed.   Obesity, Adult Obesity is having too much body fat. If you have a BMI of 30 or more, you are obese. BMI is a number that explains how much body fat you have. Obesity is often caused by taking in (consuming) more calories than your body uses. Obesity can cause serious health problems. Changing your lifestyle can help to treat obesity. Follow these instructions at home: Eating and drinking   Follow advice from your doctor about what to eat and drink. Your doctor may tell you to: ? Cut down on (limit) fast foods, sweets, and processed snack foods. ? Choose low-fat options. For example, choose low-fat milk instead of whole milk. ? Eat 5 or more servings of fruits or vegetables every day. ? Eat at home more often. This gives you more control over what you eat. ? Choose healthy foods when you eat out. ? Learn what a healthy portion size is. A portion size is the amount of a certain food that is healthy for you to eat at one time. This is different for each person. ? Keep low-fat snacks available. ? Avoid sugary drinks. These include soda, fruit juice, iced tea that is sweetened with sugar, and flavored milk. ? Eat a healthy breakfast.  Drink enough water to keep your pee (urine) clear or pale yellow.  Do not go without eating for long periods of time (do not fast).  Do not go on popular or trendy diets (fad diets). Physical Activity  Exercise often, as told by your doctor. Ask your doctor: ? What types of exercise are safe for you. ? How often you should exercise.  Warm up and stretch before being active.  Do slow stretching after being active (cool down).  Rest  between times of being active. Lifestyle  Limit how much time you spend in front of your TV, computer, or video game system (be less sedentary).  Find ways to reward yourself that do not involve food.  Limit alcohol intake to no more than 1 drink a day for nonpregnant women and 2 drinks a day for men. One drink equals 12 oz of beer, 5 oz of wine, or 1 oz of hard liquor. General instructions  Keep a weight loss journal. This can help you keep track of: ? The food that you eat. ? The exercise that you do.  Take over-the-counter and prescription medicines only as told by your doctor.  Take vitamins and supplements only as told by your doctor.  Think about joining a support group. Your doctor may be able to help with this.  Keep all follow-up visits as told by your doctor. This is important. Contact a doctor if:  You cannot meet your weight loss goal after you have changed your diet and lifestyle for 6 weeks. This information is not intended to replace advice given to you by your health care provider. Make sure you discuss any questions you have with your health care provider. Document Released: 03/15/2011 Document Revised: 05/29/2015 Document Reviewed: 10/09/2014 Elsevier Interactive Patient Education  2018 [employer].  Smoking Tobacco Information Smoking tobacco will very likely harm your health. Tobacco  contains a poisonous (toxic), colorless chemical called nicotine. Nicotine affects the brain and makes tobacco addictive. This change in your brain can make it hard to stop smoking. Tobacco also has other toxic chemicals that can hurt your body and raise your risk of many cancers. How can smoking tobacco affect me? Smoking tobacco can increase your chances of having serious health conditions, such as:  Cancer. Smoking is most commonly associated with lung cancer, but can lead to cancer in other parts of the body.  Chronic obstructive pulmonary disease (COPD). This is a long-term  lung condition that makes it hard to breathe. It also gets worse over time.  High blood pressure (hypertension), heart disease, stroke, or heart attack.  Lung infections, such as pneumonia.  Cataracts. This is when the lenses in the eyes become clouded.  Digestive problems. This may include peptic ulcers, heartburn, and gastroesophageal reflux disease (GERD).  Oral health problems, such as gum disease and tooth loss.  Loss of taste and smell.  Smoking can affect your appearance by causing:  Wrinkles.  Yellow or stained teeth, fingers, and fingernails.  Smoking tobacco can also affect your social life.  Many workplaces, [employer], hotels, and public places are tobacco-free. This means that you may experience challenges in finding places to smoke when away from home.  The cost of a smoking habit can be expensive. Expenses for someone who smokes come in two ways: ? You spend money on a regular basis to buy tobacco. ? Your health care costs in the long-term are higher if you smoke.  Tobacco smoke can also affect the health of those around you. Children of smokers have greater chances of: ? Sudden infant death syndrome (SIDS). ? Ear infections. ? Lung infections.  What lifestyle changes can be made?  Do not start smoking. Quit if you already do.  To quit smoking: ? Make a plan to quit smoking and commit yourself to it. Look for programs to help you and ask your health care provider for recommendations and ideas. ? Talk with your health care provider about using nicotine replacement medicines to help you quit. Medicine replacement medicines include gum, lozenges, patches, sprays, or pills. ? Do not replace cigarette smoking with electronic cigarettes, which are commonly called e-cigarettes. The safety of e-cigarettes is not known, and some may contain harmful chemicals. ? Avoid places, people, or situations that tempt you to smoke. ? If you try to quit but return to smoking,  don't give up hope. It is very common for people to try a number of times before they fully succeed. When you feel ready again, give it another try.  Quitting smoking might affect the way you eat as well as your weight. Be prepared to monitor your eating habits. Get support in planning and following a healthy diet.  Ask your health care provider about having regular tests (screenings) to check for cancer. This may include blood tests, imaging tests, and other tests.  Exercise regularly. Consider taking walks, joining a gym, or doing yoga or exercise classes.  Develop skills to manage your stress. These skills include meditation. What are the benefits of quitting smoking? By quitting smoking, you may:  Lower your risk of getting cancer and other diseases caused by smoking.  Live longer.  Breathe better.  Lower your blood pressure and heart rate.  Stop your addiction to tobacco.  Stop creating secondhand smoke that hurts other people.  Improve your sense of taste and smell.  Look better over time, due  to having fewer wrinkles and less staining.  What can happen if changes are not made? If you do not stop smoking, you may:  Get cancer and other diseases.  Develop COPD or other long-term (chronic) lung conditions.  Develop serious problems with your heart and blood vessels (cardiovascular system).  Need more tests to screen for problems caused by smoking.  Have higher, long-term healthcare costs from medicines or treatments related to smoking.  Continue to have worsening changes in your lungs, mouth, and nose.  Where to find support: To get support to quit smoking, consider:  Asking your health care provider for more information and resources.  Taking classes to learn more about quitting smoking.  Looking for local organizations that offer resources about quitting smoking.  Joining a support group for people who want to quit smoking in your local community.  Where to  find more information: You may find more information about quitting smoking from:  HelpGuide.org: www.helpguide.org/articles/addictions/how-to-quit-smoking.htm  https://hall.com/: smokefree.gov  American Lung Association: www.lung.org  Contact a health care provider if:  You have problems breathing.  Your lips, nose, or fingers turn blue.  You have chest pain.  You are coughing up blood.  You feel faint or you pass out.  You have other noticeable changes that cause you to worry. Summary  Smoking tobacco can negatively affect your health, the health of those around you, your finances, and your social life.  Do not start smoking. Quit if you already do. If you need help quitting, ask your health care provider.  Think about joining a support group for people who want to quit smoking in your local community. There are many effective programs that will help you to quit this behavior. This information is not intended to replace advice given to you by your health care provider. Make sure you discuss any questions you have with your health care provider. Document Released: 01/06/2016 Document Revised: 01/06/2016 Document Reviewed: 01/06/2016 Elsevier Interactive Patient Education  [employer].

## 2017-04-27 NOTE — Progress Notes (Signed)
Patient ID: Brooke [name], female    DOB: 1975/11/02, 42 y.o.   MRN: [id number]  Chief Complaint  Patient presents with  . Follow-up  . Hyperlipidemia    Allergies Ciprofloxacin; Other; Sulfa antibiotics; and Zithromax [azithromycin]  Subjective:   [name] is a 42 y.o. female who presents to [facility] today.  HPI Here for follow-up.  Has been taking her B12 tablets each day.  Came in and got a B12 shot and then another one in 2 weeks. Has started the B12 OTC 1000 mcg a day. Does feel like the shots have helped with energy. Feels tired at times but getting better. Has started walking with husband several days a week at the [employer]. Is able to walk about 60 minutes but reports that does feel tired.   Is still drinking 6 sodas a day.  Has cut down from 12 a day.  Has lost 4 pounds since her last visit.  Is trying to eat a healthier diet.  Uses vegetable oil with cooking.  Still smoking cigarettes but is cut down.  Reports that she is doing much better and smoking 1/2 to 1 pack/day.  Used to smoke 2 to 3 packs/day.  Reports she used to have a history of high blood pressure but after losing weight has gone down.  She used to be above 300 pounds.  Is currently around 250 pounds.  Does not go out the more than 2 times a month. Is still taking her omeprazole for her reflux.  Has upcoming appointment with GI.  Has never had endoscopy. Reports that she does need a referral to ENT.  Has been using the Xyzal and Nasonex on a daily basis.  Is still having sinus issues.  Has been followed by ENT in the past and deferred sinus surgery.  Reports she is still having some intermittent sinus congestion and postnasal drip.  Has been trying to walk at the [facility] rather than outside due to her allergies.  Denies any wheezing or shortness of breath.  Does not have to use an inhaler.  Has never had a diagnosis of COPD.  Denies any chest pain or shortness of breath.  No swelling in  her extremities.   Past Medical History:  Diagnosis Date  . Disc degeneration, lumbar   . Fibromyalgia   . GERD (gastroesophageal reflux disease)   . Hypertension   . Insomnia   . Knee pain   . Migraines   . Nerve damage   . Neuropathy   . Overweight   . Sinusitis, chronic   . Tendonitis     Past Surgical History:  Procedure Laterality Date  . MOUTH SURGERY     dental surgery /tooth extraction    Family History  Problem Relation Age of Onset  . Hypertension Mother   . Fibromyalgia Mother   . Neuropathy Mother   . Hypertension Father   . Hypertension Sister   . Fibromyalgia Sister   . Neuropathy Sister      Social History   Socioeconomic History  . Marital status: Single    Spouse name: Not on file  . Number of children: Not on file  . Years of education: Not on file  . Highest education level: Not on file  Occupational History  . Not on file  Social Needs  . Financial resource strain: Not on file  . Food insecurity:    Worry: Not on file    Inability: Not on file  .  Transportation needs:    Medical: Not on file    Non-medical: Not on file  Tobacco Use  . Smoking status: Current Every Day Smoker    Packs/day: 0.50    Years: 24.00    Pack years: 12.00    Types: Cigarettes  . Smokeless tobacco: Never Used  Substance and Sexual Activity  . Alcohol use: No  . Drug use: No  . Sexual activity: Yes    Birth control/protection: Injection  Lifestyle  . Physical activity:    Days per week: Not on file    Minutes per session: Not on file  . Stress: Not on file  Relationships  . Social connections:    Talks on phone: Not on file    Gets together: Not on file    Attends religious service: Not on file    Active member of club or organization: Not on file    Attends meetings of clubs or organizations: Not on file    Relationship status: Not on file  Other Topics Concern  . Not on file  Social History Narrative   Engaged to get married. Two children,  age 56 and 4. They go to school. Does not work outside the house. Lives in [place]. Eats all food groups. Exercises minimally. Has another child that lives in [place] with his father.    Current Outpatient Medications on File Prior to Visit  Medication Sig Dispense Refill  . alclomethasone (ACLOVATE) 0.05 % cream Apply 1 application topically 2 (two) times daily.     . Cyanocobalamin (VITAMIN B-12 PO) Take by mouth 2 (two) times daily.    Marland Kitchen esomeprazole (NEXIUM) 40 MG capsule Take 1 capsule (40 mg total) by mouth daily at 12 noon. 90 capsule 1  . levocetirizine (XYZAL) 5 MG tablet Take 1 tablet (5 mg total) by mouth every evening. 30 tablet 3  . linaclotide (LINZESS) 145 MCG CAPS capsule Take one a day as needed 30 capsule 0  . medroxyPROGESTERone (DEPO-PROVERA) 150 MG/ML injection Inject 1 mL (150 mg total) into the muscle every 3 (three) months. 1 mL 4  . mometasone (NASONEX) 50 MCG/ACT nasal spray Place 2 sprays into the nose daily. 17 g 12  . SUMAtriptan (IMITREX) 100 MG tablet Take 1 tablet (100 mg total) by mouth every 2 (two) hours as needed for migraine. May repeat in 2 hours if headache persists or recurs. 9 tablet 3  . tretinoin (RETIN-A) 0.025 % cream Apply topically at bedtime.     . Vitamin D, Ergocalciferol, (DRISDOL) 50000 units CAPS capsule Take 1 capsule (50,000 Units total) by mouth every 7 (seven) days. 12 capsule 1   Current Facility-Administered Medications on File Prior to Visit  Medication Dose Route Frequency Provider Last Rate Last Dose  . cyanocobalamin ((VITAMIN B-12)) injection 1,000 mcg  1,000 mcg Intramuscular Once Caren Macadam, MD        Review of Systems  Constitutional: Negative for activity change, appetite change and fever.  Eyes: Negative for visual disturbance.  Respiratory: Negative for cough, chest tightness and shortness of breath.   Cardiovascular: Negative for chest pain, palpitations and leg swelling.  Gastrointestinal: Negative for abdominal pain,  nausea and vomiting.  Genitourinary: Negative for dysuria, frequency and urgency.  Neurological: Negative for dizziness, syncope and light-headedness.  Hematological: Negative for adenopathy.     Objective:   BP 130/80 (BP Location: Left Arm, Patient Position: Sitting, Cuff Size: Normal)   Pulse 74   Temp 98 F (36.7 C) (Temporal)  Resp 16   Ht 5\' 7"  (1.702 m)   Wt 253 lb (114.8 kg)   SpO2 98%   BMI 39.63 kg/m   Physical Exam  Constitutional: She is oriented to person, place, and time. She appears well-developed and well-nourished. No distress.  HENT:  Head: Normocephalic and atraumatic.  Eyes: Pupils are equal, round, and reactive to light.  Neck: Normal range of motion. Neck supple. No thyromegaly present.  Cardiovascular: Normal rate, regular rhythm and normal heart sounds.  Pulmonary/Chest: Effort normal and breath sounds normal. No respiratory distress.  Neurological: She is alert and oriented to person, place, and time. No cranial nerve deficit.  Skin: Skin is warm and dry.  Nursing note and vitals reviewed.    Assessment and Plan   1. Mixed hyperlipidemia ASCVD risk of 6.4% over the next 10 years.  Lifestyle modifications discussed with patient including a diet emphasizing vegetables, fruits, and whole grains. Limiting intake of sodium to less than 2,400 mg per day.  Recommendations discussed include consuming low-fat dairy products, poultry, fish, legumes, non-tropical vegetable oils, and nuts; and limiting intake of sweets, sugar-sweetened beverages, and red meat. Discussed following a plan such as the Dietary Approaches to Stop Hypertension (DASH) diet. Patient to read up on this diet.   Exercise discussed to increase her HDL. Change to olive/canola oil rather than using vegetable oil.   2. Class 1 obesity due to excess calories without serious comorbidity with body mass index (BMI) of 34.0 to 34.9 in adult Cut down on sodas, drinking 6 qd. Sugar intake  discussed with patient.  The patient is asked to make an attempt to improve diet and exercise patterns to aid in medical management of this problem.  3. B12 deficiency In 3-4 months, RTC and get labs done to check B12 levels. Will give a shot today but then will just continue the OTC B12 1000 mcg a day.  - Vitamin B12  4. Vitamin D deficiency RTC in 3 months to get it check.  - VITAMIN D 25 Hydroxy (Vit-D Deficiency, Fractures)  Ov was 45 minutes. Greater than 50% of office visit spent counseling and coordinating care. Counseling given on the above information.  Caren Macadam, MD 04/27/2017

## 2017-05-09 ENCOUNTER — Other Ambulatory Visit: Payer: Medicare Other | Admitting: Adult Health

## 2017-06-10 ENCOUNTER — Encounter: Payer: Self-pay | Admitting: Family Medicine

## 2017-06-14 ENCOUNTER — Ambulatory Visit (INDEPENDENT_AMBULATORY_CARE_PROVIDER_SITE_OTHER): Payer: Medicare Other | Admitting: Adult Health

## 2017-06-14 ENCOUNTER — Encounter: Payer: Self-pay | Admitting: Adult Health

## 2017-06-14 ENCOUNTER — Other Ambulatory Visit: Payer: Self-pay

## 2017-06-14 VITALS — BP 130/86 | HR 105 | Resp 20 | Ht 67.0 in | Wt 258.0 lb

## 2017-06-14 DIAGNOSIS — Z01411 Encounter for gynecological examination (general) (routine) with abnormal findings: Secondary | ICD-10-CM

## 2017-06-14 DIAGNOSIS — Z1212 Encounter for screening for malignant neoplasm of rectum: Secondary | ICD-10-CM

## 2017-06-14 DIAGNOSIS — Z3042 Encounter for surveillance of injectable contraceptive: Secondary | ICD-10-CM | POA: Insufficient documentation

## 2017-06-14 DIAGNOSIS — Z1211 Encounter for screening for malignant neoplasm of colon: Secondary | ICD-10-CM | POA: Insufficient documentation

## 2017-06-14 DIAGNOSIS — Z01419 Encounter for gynecological examination (general) (routine) without abnormal findings: Secondary | ICD-10-CM

## 2017-06-14 DIAGNOSIS — R195 Other fecal abnormalities: Secondary | ICD-10-CM

## 2017-06-14 LAB — HEMOCCULT GUIAC POC 1CARD (OFFICE): FECAL OCCULT BLD: POSITIVE — AB

## 2017-06-14 MED ORDER — MEDROXYPROGESTERONE ACETATE 150 MG/ML IM SUSP: 150.0000 mg | INTRAMUSCULAR | 4 refills | Status: DC

## 2017-06-14 NOTE — Progress Notes (Signed)
Patient ID: Brooke [name], female   DOB: [birth year], 42 y.o.   MRN: [id number] History of Present Illness: Brooke [name] is a 42 year old white female, G3P3 on depo, in for well woman gyn exam,she had a normal pap with negative HPV 05/05/15. PCP is Dr Brooke [name]    Current Medications, Allergies, Past Medical History, Past Surgical History, Family History and Social History were reviewed in [place] record.     Review of Systems: Patient denies any headaches, hearing loss, fatigue, blurred vision, shortness of breath, chest pain, abdominal pain, problems with bowel movements(constipated at times, uses linzess), urination, or intercourse. No joint pain or mood swings. Happy with her depo.   Physical Exam:BP 130/86 (Cuff Size: Normal)   Pulse (!) 105   Resp 20   Ht 5\' 7"  (1.702 m)   Wt 258 lb (117 kg)   BMI 40.41 kg/m She checks BP at home and is not on meds. General:  Well developed, well nourished, no acute distress Skin:  Warm and dry Neck:  Midline trachea, normal thyroid, good ROM, no lymphadenopathy Lungs; Clear to auscultation bilaterally Breast:  No dominant palpable mass, retraction, or nipple discharge Cardiovascular: Regular rate and rhythm Abdomen:  Soft, non tender, no hepatosplenomegaly Pelvic:  External genitalia is normal in appearance, no lesions.  The vagina is normal in appearance. Urethra has no lesions or masses. The cervix is bulbous.  Uterus is felt to be normal size, shape, and contour.  No adnexal masses or tenderness noted.Bladder is non tender, no masses felt. Rectal: Good sphincter tone, no polyps, or hemorrhoids felt.  Hemoccult positive. Extremities/musculoskeletal:  No swelling or varicosities noted, no clubbing or cyanosis Psych:  No mood changes, alert and cooperative,seems happy PHQ 2 score 0.  Impression: 1. Well woman exam with routine gynecological exam   2. Surveillance for Depo-Provera contraception   3. Positive fecal occult  blood test   4. Screening for colorectal cancer       Plan: Will send 3 hemoccult cards home to do Meds ordered this encounter  Medications  . medroxyPROGESTERone (DEPO-PROVERA) 150 MG/ML injection    Sig: Inject 1 mL (150 mg total) into the muscle every 3 (three) months.    Dispense:  1 mL    Refill:  4    Order Specific Question:   Supervising Provider    Answer:   Brooke [name] [[id number]]  Depo in about 2 weeks Get mammogram now Pap and physical in 2 years Labs with PCP

## 2017-06-30 ENCOUNTER — Encounter: Payer: Self-pay | Admitting: *Deleted

## 2017-06-30 ENCOUNTER — Ambulatory Visit (INDEPENDENT_AMBULATORY_CARE_PROVIDER_SITE_OTHER): Payer: Medicare Other | Admitting: *Deleted

## 2017-06-30 DIAGNOSIS — Z3042 Encounter for surveillance of injectable contraceptive: Secondary | ICD-10-CM

## 2017-06-30 DIAGNOSIS — Z3202 Encounter for pregnancy test, result negative: Secondary | ICD-10-CM | POA: Diagnosis not present

## 2017-06-30 DIAGNOSIS — Z308 Encounter for other contraceptive management: Secondary | ICD-10-CM

## 2017-06-30 LAB — POCT URINE PREGNANCY: PREG TEST UR: NEGATIVE

## 2017-06-30 MED ORDER — MEDROXYPROGESTERONE ACETATE 150 MG/ML IM SUSP
150.0000 mg | Freq: Once | INTRAMUSCULAR | Status: AC
  Administered 2017-06-30: 150 mg via INTRAMUSCULAR

## 2017-06-30 NOTE — Progress Notes (Signed)
Pt here for Depo. Pt received shot in left deltoid. Pt tolerated shot well. Return in 12 weeks for next shot. JSY 

## 2017-07-01 ENCOUNTER — Other Ambulatory Visit: Payer: Self-pay | Admitting: Adult Health

## 2017-07-01 DIAGNOSIS — Z1231 Encounter for screening mammogram for malignant neoplasm of breast: Secondary | ICD-10-CM

## 2017-07-26 ENCOUNTER — Other Ambulatory Visit: Payer: Self-pay

## 2017-07-26 ENCOUNTER — Ambulatory Visit (INDEPENDENT_AMBULATORY_CARE_PROVIDER_SITE_OTHER): Payer: Medicare Other | Admitting: Family Medicine

## 2017-07-26 ENCOUNTER — Encounter: Payer: Self-pay | Admitting: Family Medicine

## 2017-07-26 VITALS — BP 128/72 | HR 96 | Temp 98.8°F | Resp 18 | Ht 68.0 in | Wt 258.1 lb

## 2017-07-26 DIAGNOSIS — M542 Cervicalgia: Secondary | ICD-10-CM

## 2017-07-26 DIAGNOSIS — F320 Major depressive disorder, single episode, mild: Secondary | ICD-10-CM | POA: Diagnosis not present

## 2017-07-26 DIAGNOSIS — E559 Vitamin D deficiency, unspecified: Secondary | ICD-10-CM | POA: Diagnosis not present

## 2017-07-26 DIAGNOSIS — E538 Deficiency of other specified B group vitamins: Secondary | ICD-10-CM | POA: Diagnosis not present

## 2017-07-26 DIAGNOSIS — Z720 Tobacco use: Secondary | ICD-10-CM

## 2017-07-26 DIAGNOSIS — G8929 Other chronic pain: Secondary | ICD-10-CM

## 2017-07-26 DIAGNOSIS — M5442 Lumbago with sciatica, left side: Secondary | ICD-10-CM | POA: Diagnosis not present

## 2017-07-26 DIAGNOSIS — M5441 Lumbago with sciatica, right side: Secondary | ICD-10-CM | POA: Diagnosis not present

## 2017-07-26 MED ORDER — DULOXETINE HCL 30 MG PO CPEP: 30.0000 mg | ORAL_CAPSULE | Freq: Every day | ORAL | 1 refills | Status: DC

## 2017-07-26 MED ORDER — METHYLPREDNISOLONE ACETATE 80 MG/ML IJ SUSP
80.0000 mg | Freq: Once | INTRAMUSCULAR | Status: AC
  Administered 2017-07-26: 80 mg via INTRAMUSCULAR

## 2017-07-26 MED ORDER — METHOCARBAMOL 500 MG PO TABS: 500.0000 mg | ORAL_TABLET | Freq: Four times a day (QID) | ORAL | 0 refills | Status: DC

## 2017-07-26 MED ORDER — KETOROLAC TROMETHAMINE 60 MG/2ML IM SOLN
60.0000 mg | Freq: Once | INTRAMUSCULAR | Status: AC
  Administered 2017-07-26: 60 mg via INTRAMUSCULAR

## 2017-07-26 MED ORDER — CYCLOBENZAPRINE HCL 10 MG PO TABS: 10.0000 mg | ORAL_TABLET | Freq: Three times a day (TID) | ORAL | 0 refills | Status: DC | PRN

## 2017-07-26 MED ORDER — DICLOFENAC SODIUM 75 MG PO TBEC: 75.0000 mg | DELAYED_RELEASE_TABLET | Freq: Two times a day (BID) | ORAL | 0 refills | Status: DC

## 2017-07-26 NOTE — Progress Notes (Signed)
Patient ID: Brooke [name], female    DOB: Jun 15, 1975, 42 y.o.   MRN: [id number]  Chief Complaint  Patient presents with  . Back Pain    radiates all the way down to her legs  . Leg Pain    Allergies Ciprofloxacin; Other; Sulfa antibiotics; and Zithromax [azithromycin]  Subjective:   Brooke [name] is a 42 y.o. female who presents to [facility] today.  HPI Here for follow up. Reports that has been having pain in back and whole body for 7-8 years. Reports that was diagnosed with FM. Reports that for the past 2 months the pain has worsened and it has gotten worse.  Has pain now in lower back and it is radiating down her legs. She is crying today b/c the pain is bad and "cannot do things well" b/c of the pain. It hurts to walk, hard to sleep, and it is constant. Reports that  Has chronic pain in legs and feet, back, arms, neck, and head. Feels tired all the time and cannot sleep at night. Reports that gets irritated quickly due to the pain. Feels like has muscle spasms in legs and back. Feels stiff int he mornings when wakes up. Has tingling in feet and soreness in legs and back. Reports that has 4 bad discs. 2 bad discs in back and 2 in the neck. Had MRI in [place] in 2014. Did not have anything done about the discs at that time and has dealt with the pain. Was in pain management in [place] and was on oxycodone 15 mg qid. Did not get injections in back.  Pain is daily in back and legs, feels like someone has twisted back and bones all the way down to her feet. Pain is a 10/10. Nothing makes pain better. Pain is worse with moving and switching positions. Legs feel like they are heavy. Does not get numbness in legs but some in feet and hands. Has not been evaluated for the back and neck pain in [place].  Reports that over the past couple months when she has been dealing with the pain in her mood is not been very good.  Has been on Cymbalta in the past but at low doses.  Reports  that she is not sure if it helped much with her pain it did help with her mood in the past.  She reports that over the past couple months due to the pain she has been more irritable and feeling down.  Denies any suicidal or homicidal ideation.  Has been taking her B12 and vitamin D.  Is not sleeping great because of the pain.  Reports she does get some muscle tightness and spasms that keep her from sleeping well.  Has not been using any over-the-counter medications for the pain.  Is not allergic to anti-inflammatories.  Reports that she did used to see a pain doctor but then he moved and has not been on any pain medication for quite some time. Back Pain  This is a chronic problem. The current episode started more than 1 year ago. The problem occurs constantly. The problem has been waxing and waning since onset. The pain is present in the lumbar spine. The quality of the pain is described as aching, stabbing and cramping. The pain radiates to the right foot and left foot. The pain is at a severity of 10/10. The pain is moderate. The pain is the same all the time. The symptoms are aggravated by bending, coughing, lying  down, sitting, standing, position and twisting. Stiffness is present in the morning. Associated symptoms include leg pain, numbness, paresthesias and tingling. Pertinent negatives include no abdominal pain, bladder incontinence, bowel incontinence, chest pain, dysuria, fever, headaches, paresis, pelvic pain, perianal numbness, weakness or weight loss. Risk factors include lack of exercise, obesity, poor posture and sedentary lifestyle. She has tried nothing (Has not been on any medications in the recent months) for the symptoms.    Past Medical History:  Diagnosis Date  . Disc degeneration, lumbar   . Fibromyalgia   . GERD (gastroesophageal reflux disease)   . Hypertension   . Insomnia   . Knee pain   . Migraines   . Nerve damage   . Neuropathy   . Overweight   . Sinusitis, chronic   .  Tendonitis     Past Surgical History:  Procedure Laterality Date  . MOUTH SURGERY     dental surgery /tooth extraction    Family History  Problem Relation Age of Onset  . Hypertension Mother   . Fibromyalgia Mother   . Neuropathy Mother   . Hypertension Father   . Hypertension Sister   . Fibromyalgia Sister   . Neuropathy Sister      Social History   Socioeconomic History  . Marital status: Single    Spouse name: Not on file  . Number of children: Not on file  . Years of education: Not on file  . Highest education level: Not on file  Occupational History  . Not on file  Social Needs  . Financial resource strain: Not on file  . Food insecurity:    Worry: Not on file    Inability: Not on file  . Transportation needs:    Medical: Not on file    Non-medical: Not on file  Tobacco Use  . Smoking status: Current Every Day Smoker    Packs/day: 0.50    Years: 24.00    Pack years: 12.00    Types: Cigarettes  . Smokeless tobacco: Never Used  Substance and Sexual Activity  . Alcohol use: No  . Drug use: No  . Sexual activity: Yes    Birth control/protection: Injection  Lifestyle  . Physical activity:    Days per week: Not on file    Minutes per session: Not on file  . Stress: Not on file  Relationships  . Social connections:    Talks on phone: Not on file    Gets together: Not on file    Attends religious service: Not on file    Active member of club or organization: Not on file    Attends meetings of clubs or organizations: Not on file    Relationship status: Not on file  Other Topics Concern  . Not on file  Social History Narrative   Engaged to get married. Two children, age 41 and 23. They go to school. Does not work outside the house. Lives in [place]. Eats all food groups. Exercises minimally. Has another child that lives in [place] with his father.     Review of Systems  Constitutional: Positive for fatigue. Negative for activity change, appetite change,  chills, fever, unexpected weight change and weight loss.  HENT: Negative for hearing loss, trouble swallowing and voice change.   Eyes: Negative for photophobia and visual disturbance.  Respiratory: Negative for apnea, choking, wheezing and stridor.   Cardiovascular: Negative for chest pain, palpitations and leg swelling.  Gastrointestinal: Negative for abdominal pain, anal bleeding and bowel incontinence.  Endocrine: Negative for polyphagia and polyuria.  Genitourinary: Negative for bladder incontinence, difficulty urinating, dysuria, flank pain, frequency, hematuria, pelvic pain and urgency.  Musculoskeletal: Positive for arthralgias, back pain, gait problem, neck pain and neck stiffness. Negative for joint swelling and myalgias.  Skin: Negative for rash.  Neurological: Positive for tingling, numbness and paresthesias. Negative for dizziness, tremors, seizures, syncope, facial asymmetry, speech difficulty, weakness, light-headedness and headaches.  Hematological: Negative for adenopathy. Does not bruise/bleed easily.  Psychiatric/Behavioral: Positive for dysphoric mood. Negative for agitation, behavioral problems, confusion, decreased concentration, self-injury, sleep disturbance and suicidal ideas. The patient is not nervous/anxious.    Current Outpatient Medications on File Prior to Visit  Medication Sig Dispense Refill  . alclomethasone (ACLOVATE) 0.05 % cream Apply 1 application topically 2 (two) times daily.     . Cyanocobalamin (VITAMIN B-12 PO) Take by mouth 2 (two) times daily.    Marland Kitchen esomeprazole (NEXIUM) 40 MG capsule Take 1 capsule (40 mg total) by mouth daily at 12 noon. 90 capsule 1  . levocetirizine (XYZAL) 5 MG tablet Take 1 tablet (5 mg total) by mouth every evening. 30 tablet 3  . linaclotide (LINZESS) 145 MCG CAPS capsule Take one a day as needed 30 capsule 0  . medroxyPROGESTERone (DEPO-PROVERA) 150 MG/ML injection Inject 1 mL (150 mg total) into the muscle every 3 (three)  months. 1 mL 4  . mometasone (NASONEX) 50 MCG/ACT nasal spray Place 2 sprays into the nose daily. 17 g 12  . SUMAtriptan (IMITREX) 100 MG tablet Take 1 tablet (100 mg total) by mouth every 2 (two) hours as needed for migraine. May repeat in 2 hours if headache persists or recurs. 9 tablet 3  . tretinoin (RETIN-A) 0.025 % cream Apply topically at bedtime.     . Vitamin D, Ergocalciferol, (DRISDOL) 50000 units CAPS capsule Take 1 capsule (50,000 Units total) by mouth every 7 (seven) days. 12 capsule 1   No current facility-administered medications on file prior to visit.      Objective:   BP 128/72 (BP Location: Left Arm, Patient Position: Sitting, Cuff Size: Large)   Pulse 96   Temp 98.8 F (37.1 C) (Temporal)   Resp 18   Ht 5\' 8"  (1.727 m)   Wt 258 lb 1.9 oz (117.1 kg)   SpO2 96%   BMI 39.25 kg/m   Physical Exam  Constitutional: She is oriented to person, place, and time. She appears well-developed and well-nourished.  HENT:  Head: Normocephalic and atraumatic.  Eyes: Pupils are equal, round, and reactive to light. Conjunctivae and EOM are normal. No scleral icterus.  Neck: Normal range of motion. Neck supple. No JVD present.  Cardiovascular: Normal rate, regular rhythm and normal heart sounds.  Pulmonary/Chest: Effort normal and breath sounds normal.  Abdominal: Soft. Bowel sounds are normal. She exhibits no distension. There is no tenderness.  Lymphadenopathy:    She has no cervical adenopathy.  Neurological: She is alert and oriented to person, place, and time. She displays normal reflexes. No cranial nerve deficit. Coordination normal.  Skin: Skin is warm and dry.  Psychiatric: She has a normal mood and affect. Her behavior is normal. Judgment and thought content normal.  Vitals reviewed.   Depression screen [facility] 2/9 07/26/2017 06/14/2017 04/27/2017 02/25/2017 09/30/2016  Decreased Interest 2 0 1 0 0  Down, Depressed, Hopeless 1 0 0 0 0  PHQ - 2 Score 3 0 1 0 0  Altered  sleeping 3 - - - -  Tired, decreased energy  3 - - - -  Change in appetite 2 - - - -  Feeling bad or failure about yourself  0 - - - -  Trouble concentrating 2 - - - -  Moving slowly or fidgety/restless 0 - - - -  Suicidal thoughts 0 - - - -  PHQ-9 Score 13 - - - -  Difficult doing work/chores Extremely dIfficult - - - -    Assessment and Plan   1. B12 deficiency We will check levels and continue supplementation as needed. - Vitamin B12  2. Vitamin D deficiency We will check levels and continue supplementation as needed. - VITAMIN D 25 Hydroxy (Vit-D Deficiency, Fractures)  3. Morbid obesity ([place]) Patient has been counseled regarding diet, exercise, and weight loss modifications.  She has gained 5 pounds since her last visit.  She reports that she has cut her sodas down from 12 a day 6 months ago to now 3 a day.  I do believe that her morbid obesity affects her back pain and most likely fibromyalgia.  4. Neck pain Chronic, no worsening in her symptoms.  Referral for evaluation - Ambulatory referral to Orthopedic Surgery  5. Chronic bilateral low back pain with bilateral sciatica Chronic, with acute exacerbation over the past couple months.  Has not been on any medication recently.  Discussed with patient that defer starting opioids at this time and needs evaluation.  Refer to orthopedics for evaluation and probable MRI. Discussed risks of cardiovascular thrombotic events related to NSAIDS. Discussed increased risk of AMI and CVA. Discussed risk of serious GI adverse events including bleeding, ulcers, and perforation. Patient understands risks of this medication.  Patient counseled in detail regarding the risks of medication. Told to call or return to clinic if develop any worrisome signs or symptoms. Patient voiced understanding.  Patient was counseled regarding worrisome signs and symptoms of low back pain and if those of develop she should contact medical help.  She voiced  understanding. - Ambulatory referral to Orthopedic Surgery - methylPREDNISolone acetate (DEPO-MEDROL) injection 80 mg - diclofenac (VOLTAREN) 75 MG EC tablet; Take 1 tablet (75 mg total) by mouth 2 (two) times daily.  Dispense: 30 tablet; Refill: 0 - ketorolac (TORADOL) injection 60 mg  6. Tobacco abuse The 5 A's Model for treating Tobacco Use and Dependence was used today. I have identified and documented tobacco use status for this patient. I have urged the patient to quit tobacco use. At this time, the patient is unwilling and not ready to attempt to quit. I have provided patient with information regarding risks, cessation techniques, and interventions that might increase future attempts to quit smoking. I will plan on again addressing tobacco dependence at the next visit.  Patient is not ready to discontinue smoking at this time but is willing to continue to cut down on her tobacco use. 7. Depression, major, single episode, mild (HCC) - DULoxetine (CYMBALTA) 30 MG capsule; Take 1 capsule (30 mg total) by mouth daily.  Dispense: 30 capsule; Refill: 1 We will try Cymbalta as a means for improvement in her mood, chronic pain, fibromyalgia.  Counseled regarding risk versus benefits of medication.  Did discuss with patient that we will likely need to titrate up her dose at her next visit. Suicide risks evaluated and documented in note if present or in the area below.  Patient does not have/denies the following risks: previous suicide attempts, family history of suicide, access to lethal means, prior history of psychiatric disorder, history of alcohol  or substance abuse disorder, recent loss of a loved one, or severe hopelessness. Patient denies access to firearms or if present will have removed from home/access.  Patient specifically denies suicide ideation. Patient has access/information to healthcare contacts if situation or mood changes where patient is a risk to self or others or mood becomes  unstable.   During the encounter, the patient had good eye contact and firm handshake regarding safety contract and agreement to seek help if mood worsens and not to harm self.   Patient understands the treatment plan and is in agreement. Agrees to keep follow up and call prior or return to clinic if needed.  Patient will call with any questions, concerns, or worrisome symptoms. Return in about 1 month (around 08/23/2017) for follow up. Caren Macadam, MD 07/26/2017

## 2017-07-29 ENCOUNTER — Ambulatory Visit (HOSPITAL_COMMUNITY): Payer: Medicare Other

## 2017-08-09 ENCOUNTER — Ambulatory Visit: Payer: Medicare Other | Admitting: Family Medicine

## 2017-08-18 ENCOUNTER — Ambulatory Visit (INDEPENDENT_AMBULATORY_CARE_PROVIDER_SITE_OTHER): Payer: Medicare Other | Admitting: Orthopaedic Surgery

## 2017-08-18 ENCOUNTER — Ambulatory Visit (INDEPENDENT_AMBULATORY_CARE_PROVIDER_SITE_OTHER): Payer: Self-pay

## 2017-08-18 ENCOUNTER — Encounter (INDEPENDENT_AMBULATORY_CARE_PROVIDER_SITE_OTHER): Payer: Self-pay | Admitting: Orthopaedic Surgery

## 2017-08-18 VITALS — BP 139/92 | HR 90 | Ht 67.0 in | Wt 254.0 lb

## 2017-08-18 DIAGNOSIS — M5030 Other cervical disc degeneration, unspecified cervical region: Secondary | ICD-10-CM | POA: Diagnosis not present

## 2017-08-18 DIAGNOSIS — G8929 Other chronic pain: Secondary | ICD-10-CM | POA: Diagnosis not present

## 2017-08-18 DIAGNOSIS — M545 Low back pain: Secondary | ICD-10-CM

## 2017-08-18 NOTE — Progress Notes (Signed)
Office Visit Note   Patient: Brooke [name]           Date of Birth: 12-Aug-1975           MRN: [id number] Visit Date: 08/18/2017              Requested by: Caren Macadam, [address] [place], [place] [id number] PCP: Caren Macadam, MD   Assessment & Plan: Visit Diagnoses:  1. Chronic bilateral low back pain, with sciatica presence unspecified   2. Other cervical disc degeneration, unspecified cervical region     Plan: We discussed weight loss, dieting.  She states that one point in the distant past she lost about 100 pounds.  Maximum weight for her was about 300 pounds currently she weighs 254.  We discussed pool therapy.  Will order formal physical therapy recheck in 5 to 6 weeks.  We discussed the disc degeneration I gave her copies of her x-rays.  Previous x-rays demonstrated some disc degeneration at C6-7.  She continues to have some pain symptoms between her scapula.  The lumbar spine seems to be giving her more problems in her neck.  Follow-Up Instructions: Return in about 6 weeks (around 09/29/2017).   Orders:  Orders Placed This Encounter  Procedures  . XR Lumbar Spine 2-3 Views   No orders of the defined types were placed in this encounter.     Procedures: No procedures performed   Clinical Data: No additional findings.   Subjective: Chief Complaint  Patient presents with  . Lower Back - Pain    HPI 42 year old female referred by Dr. [name] for chronic low back pain.  She is been disabled since 2011 she also has diagnosis of fibromyalgia.  Problems with hypertension, migraines, hyperlipidemia, obesity, history of leg cramps.  She is been treated with Cymbalta in the past just recently restarted a few weeks ago also taking diclofenac and also Robaxin.  She was in a pain clinic in [place] and was on oxycodone 15 mg 4 times a day.  She denied withdrawal symptoms when she stopped the medication.  She states she is able to walk several blocks.  She has  increased pain with prolonged standing and sitting.  She does better when she changes positions. Review of Systems point review of systems positive for thyroid condition, hyperlipidemia, hypertension, migraines, patient reported diagnosis of fibromyalgia, neck pain with disc degeneration by MRI C 6-7 image by Dr. Luna Glasgow in the past.  MRI done in [place] patient states she has some disc problems at 4 levels.  She has a mother's had a total of about 8 back surgeries.  Patient's been on some vitamin B, vitamin D supplements .  Treated in the recent past with injection of prednisone and Toradol.  Patient's 1 pack/day smoker.  Positive for anxiety depression teeth and gum disease.  Bronchitis.   Objective: Vital Signs: BP (!) 139/92   Pulse 90   Ht 5\' 7"  (1.702 m)   Wt 254 lb (115.2 kg)   BMI 39.78 kg/m   Physical Exam  Constitutional: She is oriented to person, place, and time. She appears well-developed.  HENT:  Head: Normocephalic.  Right Ear: External ear normal.  Left Ear: External ear normal.  Eyes: Pupils are equal, round, and reactive to light.  Neck: No tracheal deviation present. No thyromegaly present.  Cardiovascular: Normal rate.  Pulmonary/Chest: Effort normal.  Abdominal: Soft.  obese  Neurological: She is alert and oriented to person, place, and time.  Skin: Skin is warm and dry.  Psychiatric: She has a normal mood and affect. Her behavior is normal.    Ortho Exam patient has 1+ knee jerk ankle jerk biceps triceps brachioradialis reflexes are 1+ and symmetrical.  She has some tenderness of the brachial plexus right and left.  Tenderness over the right coracoid but not left.  Long head of the biceps tendon on the right none on the left.  Negative impingement right left shoulder.  No biceps triceps atrophy.  Knee and ankle jerk are intact.  She has some discomfort straight leg raising at 90 degrees.  No limitation of hip internal/external rotation no quad atrophy.  Normal heel  toe gait.  She can walk on her heels as well as her toes.  Anterior tib gastrocsoleus EHL anterior tib is intact and symmetrical.  Specialty Comments:  No specialty comments available.  Imaging: Xr Lumbar Spine 2-3 Views  Result Date: 08/18/2017 The lateral lumbar x-rays and spot L5-S1 lateral are obtained and reviewed.  This shows 50% disc space narrowing at L4-5 and L5-S1 level.  There is facet arthropathy noted on AP x-ray no scoliosis.  No spondylolisthesis.  Securely joints are normal. Impression: Negative for acute changes.  Disc degeneration with facet changes at L4-5 and L5-S1.    PMFS History: Patient Active Problem List   Diagnosis Date Noted  . Screening for colorectal cancer 06/14/2017  . Surveillance for Depo-Provera contraception 06/14/2017  . Hyperlipidemia 02/25/2017  . Borderline abnormal thyroid function test 02/25/2017  . Leg cramp 02/25/2017  . Migraines 09/30/2016  . Migraine without aura and responsive to treatment 09/30/2016  . Tired 05/06/2016  . Well woman exam with routine gynecological exam 05/06/2016  . Obesity 05/05/2015  . Smoker 05/05/2015  . Elevated BP 05/05/2015  . Neck pain 03/06/2015   Past Medical History:  Diagnosis Date  . Disc degeneration, lumbar   . Fibromyalgia   . GERD (gastroesophageal reflux disease)   . Hypertension   . Insomnia   . Knee pain   . Migraines   . Nerve damage   . Neuropathy   . Overweight   . Sinusitis, chronic   . Tendonitis     Family History  Problem Relation Age of Onset  . Hypertension Mother   . Fibromyalgia Mother   . Neuropathy Mother   . Hypertension Father   . Hypertension Sister   . Fibromyalgia Sister   . Neuropathy Sister     Past Surgical History:  Procedure Laterality Date  . MOUTH SURGERY     dental surgery /tooth extraction   Social History   Occupational History  . Not on file  Tobacco Use  . Smoking status: Current Every Day Smoker    Packs/day: 0.50    Years: 24.00     Pack years: 12.00    Types: Cigarettes  . Smokeless tobacco: Never Used  Substance and Sexual Activity  . Alcohol use: No  . Drug use: No  . Sexual activity: Yes    Birth control/protection: Injection

## 2017-08-18 NOTE — Addendum Note (Signed)
Addended by: Meyer Cory on: 08/18/2017 10:28 AM   Modules accepted: Orders

## 2017-08-22 ENCOUNTER — Ambulatory Visit (HOSPITAL_COMMUNITY): Payer: Medicare Other

## 2017-08-26 ENCOUNTER — Other Ambulatory Visit: Payer: Self-pay

## 2017-08-26 ENCOUNTER — Encounter (HOSPITAL_COMMUNITY): Payer: Self-pay

## 2017-08-26 ENCOUNTER — Ambulatory Visit (INDEPENDENT_AMBULATORY_CARE_PROVIDER_SITE_OTHER): Payer: Medicare Other | Admitting: Family Medicine

## 2017-08-26 ENCOUNTER — Ambulatory Visit (HOSPITAL_COMMUNITY)
Admission: RE | Admit: 2017-08-26 | Discharge: 2017-08-26 | Disposition: A | Discharge status: Home / Self Care | Payer: Medicare Other | Source: Ambulatory Visit | Attending: Adult Health | Admitting: Adult Health

## 2017-08-26 ENCOUNTER — Encounter: Payer: Self-pay | Admitting: Family Medicine

## 2017-08-26 VITALS — BP 134/86 | HR 97 | Temp 98.5°F | Resp 14 | Ht 67.0 in | Wt 249.1 lb

## 2017-08-26 DIAGNOSIS — Z720 Tobacco use: Secondary | ICD-10-CM | POA: Diagnosis not present

## 2017-08-26 DIAGNOSIS — M5442 Lumbago with sciatica, left side: Secondary | ICD-10-CM | POA: Diagnosis not present

## 2017-08-26 DIAGNOSIS — G8929 Other chronic pain: Secondary | ICD-10-CM

## 2017-08-26 DIAGNOSIS — F320 Major depressive disorder, single episode, mild: Secondary | ICD-10-CM | POA: Diagnosis not present

## 2017-08-26 DIAGNOSIS — Z1231 Encounter for screening mammogram for malignant neoplasm of breast: Secondary | ICD-10-CM

## 2017-08-26 DIAGNOSIS — M5441 Lumbago with sciatica, right side: Secondary | ICD-10-CM

## 2017-08-26 MED ORDER — DULOXETINE HCL 60 MG PO CPEP: 60.0000 mg | ORAL_CAPSULE | Freq: Every day | ORAL | 0 refills | Status: DC

## 2017-08-26 NOTE — Progress Notes (Signed)
Patient ID: Brooke [name], female    DOB: 06-22-1975, 42 y.o.   MRN: [id number]  Chief Complaint  Patient presents with  . Back Pain    follow up. some days worse than others.   . Depression    Allergies Ciprofloxacin; Other; Sulfa antibiotics; and Zithromax [azithromycin]  Subjective:   Brooke [name] is a 42 y.o. female who presents to [facility] today.  HPI Brooke [name] presents today for follow-up visit.  She has been on the Cymbalta 30 mg a day for about a month.  She does report that she has been feeling better.  Still some persistent trouble concentrating, trouble sleeping, and lack of interest.  She reports her mood is improved.  Overall she has less pain and symptoms of depressed mood.  She reports her mood is improved.  She does not feel down, depressed, or hopeless.  Would like an increase in the medication.  Has been seen by orthopedics in starting physical therapy for her back.  Is hopeful he can get an MRI of her back in the near future.  He is using the Voltaren and muscle relaxers as needed for her back.  She is still in pain but she is able to move around.  She is more motivated to improve her health status.  She has lost 9 pounds since her last visit.  She is cut down on her sodas to 2 a day.  She is cut down on her snacking and sweets.  She is still smoking the same amount of cigarettes a day and is not yet ready to quit.  She denies any suicidal or homicidal ideations.  No chest pain, shortness of breath, lower extremity edema, cough, or palpitations.  She has not had any side effects with the Cymbalta.  Energy level has improved.  Is not having any stomach pain or reflux symptoms.  She was supposed to get her vitamin D and B12 at the last visit but did not get it done.  She came in today this morning to the lab and got the blood work.  She does have a scheduled follow-up with her new PCP in [place], [place] within the next month.   Past Medical History:   Diagnosis Date  . Disc degeneration, lumbar   . Fibromyalgia   . GERD (gastroesophageal reflux disease)   . Hypertension   . Insomnia   . Knee pain   . Migraines   . Nerve damage   . Neuropathy   . Overweight   . Sinusitis, chronic   . Tendonitis     Past Surgical History:  Procedure Laterality Date  . MOUTH SURGERY     dental surgery /tooth extraction    Family History  Problem Relation Age of Onset  . Hypertension Mother   . Fibromyalgia Mother   . Neuropathy Mother   . Hypertension Father   . Hypertension Sister   . Fibromyalgia Sister   . Neuropathy Sister      Social History   Socioeconomic History  . Marital status: Single    Spouse name: Not on file  . Number of children: Not on file  . Years of education: Not on file  . Highest education level: Not on file  Occupational History  . Not on file  Social Needs  . Financial resource strain: Not on file  . Food insecurity:    Worry: Not on file    Inability: Not on file  . Transportation needs:  Medical: Not on file    Non-medical: Not on file  Tobacco Use  . Smoking status: Current Every Day Smoker    Packs/day: 0.50    Years: 24.00    Pack years: 12.00    Types: Cigarettes  . Smokeless tobacco: Never Used  Substance and Sexual Activity  . Alcohol use: No  . Drug use: No  . Sexual activity: Yes    Birth control/protection: Injection  Lifestyle  . Physical activity:    Days per week: Not on file    Minutes per session: Not on file  . Stress: Not on file  Relationships  . Social connections:    Talks on phone: Not on file    Gets together: Not on file    Attends religious service: Not on file    Active member of club or organization: Not on file    Attends meetings of clubs or organizations: Not on file    Relationship status: Not on file  Other Topics Concern  . Not on file  Social History Narrative   Engaged to get married. Two children, age 38 and 44. They go to school. Does not  work outside the house. Lives in [place]. Eats all food groups. Exercises minimally. Has another child that lives in [place] with his father.     Review of Systems  Constitutional: Negative for activity change, appetite change, diaphoresis, fever and unexpected weight change.  Eyes: Negative for visual disturbance.  Respiratory: Negative for cough, chest tightness and shortness of breath.   Cardiovascular: Negative for chest pain, palpitations and leg swelling.  Gastrointestinal: Negative for abdominal pain, nausea and vomiting.  Genitourinary: Negative for dysuria, frequency and urgency.  Musculoskeletal: Positive for arthralgias.       Still chronic back and neck pain.   Skin: Negative for rash.  Neurological: Negative for dizziness, syncope and light-headedness.  Hematological: Negative for adenopathy.  Psychiatric/Behavioral: Negative for behavioral problems, confusion, hallucinations and sleep disturbance. The patient is not nervous/anxious and is not hyperactive.    Current Outpatient Medications on File Prior to Visit  Medication Sig Dispense Refill  . alclomethasone (ACLOVATE) 0.05 % cream Apply 1 application topically 2 (two) times daily.     . Cyanocobalamin (VITAMIN B-12 PO) Take by mouth 2 (two) times daily.    . diclofenac (VOLTAREN) 75 MG EC tablet Take 1 tablet (75 mg total) by mouth 2 (two) times daily. 30 tablet 0  . esomeprazole (NEXIUM) 40 MG capsule Take 1 capsule (40 mg total) by mouth daily at 12 noon. 90 capsule 1  . levocetirizine (XYZAL) 5 MG tablet Take 1 tablet (5 mg total) by mouth every evening. 30 tablet 3  . linaclotide (LINZESS) 145 MCG CAPS capsule Take one a day as needed 30 capsule 0  . medroxyPROGESTERone (DEPO-PROVERA) 150 MG/ML injection Inject 1 mL (150 mg total) into the muscle every 3 (three) months. 1 mL 4  . methocarbamol (ROBAXIN) 500 MG tablet Take 1 tablet (500 mg total) by mouth 4 (four) times daily. 20 tablet 0  . mometasone (NASONEX) 50 MCG/ACT  nasal spray Place 2 sprays into the nose daily. 17 g 12  . SUMAtriptan (IMITREX) 100 MG tablet Take 1 tablet (100 mg total) by mouth every 2 (two) hours as needed for migraine. May repeat in 2 hours if headache persists or recurs. 9 tablet 3  . tretinoin (RETIN-A) 0.025 % cream Apply topically at bedtime.     . Vitamin D, Ergocalciferol, (DRISDOL) 50000 units CAPS  capsule Take 1 capsule (50,000 Units total) by mouth every 7 (seven) days. 12 capsule 1   No current facility-administered medications on file prior to visit.      Objective:   BP 134/86 (BP Location: Right Arm, Patient Position: Sitting, Cuff Size: Large)   Pulse 97   Temp 98.5 F (36.9 C) (Temporal)   Resp 14   Ht 5\' 7"  (1.702 m)   Wt 249 lb 1.3 oz (113 kg)   SpO2 97% Comment: room air  BMI 39.01 kg/m   Physical Exam  Constitutional: She is oriented to person, place, and time. She appears well-developed and well-nourished.  HENT:  Head: Normocephalic and atraumatic.  Eyes: Pupils are equal, round, and reactive to light. Conjunctivae and EOM are normal.  Neck: Normal range of motion. Neck supple.  Cardiovascular: Normal rate, regular rhythm and normal heart sounds.  Pulmonary/Chest: Effort normal and breath sounds normal. No respiratory distress.  Neurological: She is alert and oriented to person, place, and time. No cranial nerve deficit.  Skin: Skin is warm and dry. Capillary refill takes less than 2 seconds.  Psychiatric: She has a normal mood and affect. Her behavior is normal. Judgment and thought content normal.  Vitals reviewed.   Depression screen [facility] 2/9 08/26/2017 07/26/2017 06/14/2017 04/27/2017 02/25/2017  Decreased Interest 1 2 0 1 0  Down, Depressed, Hopeless 0 1 0 0 0  PHQ - 2 Score 1 3 0 1 0  Altered sleeping 2 3 - - -  Tired, decreased energy 1 3 - - -  Change in appetite 0 2 - - -  Feeling bad or failure about yourself  0 0 - - -  Trouble concentrating 1 2 - - -  Moving slowly or fidgety/restless 0  0 - - -  Suicidal thoughts 0 0 - - -  PHQ-9 Score 5 13 - - -  Difficult doing work/chores Somewhat difficult Extremely dIfficult - - -    Assessment and Plan  1. Depression, major, single episode, mild (HCC) Improved, but would still like an increase in medication because still does have some symptoms in dealing with chronic back pain. - DULoxetine (CYMBALTA) 60 MG capsule; Take 1 capsule (60 mg total) by mouth daily.  Dispense: 30 capsule; Refill: 0 Suicide risks evaluated and documented in note if present or in the area below.  Patient has protective factors of family and community support.  Patient reports that family believes is behaving rationally. Patient displays problem solving skills.   Patient specifically denies suicide ideation. Patient has access/information to healthcare contacts if situation or mood changes where patient is a risk to self or others or mood becomes unstable.   During the encounter, the patient had good eye contact and firm handshake regarding safety contract and agreement to seek help if mood worsens and not to harm self.   Patient understands the treatment plan and is in agreement. Agrees to keep follow up and call prior or return to clinic if needed.   2. Chronic bilateral low back pain with bilateral sciatica Continue medications as directed.  Keep follow-up with orthopedics.  Continue PT as directed by orthopedics. Did discuss with patient that continued weight loss would most likely help her back pain and overall her fibromyalgia symptoms.  3. Morbid obesity ([place]) Lifestyle modifications discussed with patient including a diet emphasizing vegetables, fruits, and whole grains. Limiting intake of sodium to less than 2,400 mg per day.  Recommendations discussed include consuming low-fat dairy products, poultry,  fish, legumes, non-tropical vegetable oils, and nuts; and limiting intake of sweets, sugar-sweetened beverages, and red meat. Discussed following a  plan such as the Dietary Approaches to Stop Hypertension (DASH) diet. Patient to read up on this diet.  The patient is asked to make an attempt to improve diet and exercise patterns to aid in medical management of this problem.   4. Tobacco abuse The 5 A's Model for treating Tobacco Use and Dependence was used today. I have identified and documented tobacco use status for this patient. I have urged the patient to quit tobacco use. At this time, the patient is unwilling and not ready to attempt to quit. I have provided patient with information regarding risks, cessation techniques, and interventions that might increase future attempts to quit smoking. I will plan on again addressing tobacco dependence at the next visit.  Patient was told to follow-up with her new PCP in 3 to 4 weeks or sooner if needed.  She was told to call our office with any questions, concerns, or problems.  If she has any abrupt changes in her mood she will contact medical help as directed. No follow-ups on file. Caren Macadam, MD 08/26/2017

## 2017-08-27 LAB — VITAMIN D 25 HYDROXY (VIT D DEFICIENCY, FRACTURES): Vit D, 25-Hydroxy: 31 ng/mL (ref 30–100)

## 2017-08-27 LAB — VITAMIN B12: Vitamin B-12: 532 pg/mL (ref 200–1100)

## 2017-08-30 ENCOUNTER — Encounter: Payer: Self-pay | Admitting: Family Medicine

## 2017-09-09 ENCOUNTER — Ambulatory Visit: Payer: Medicare Other | Admitting: Gastroenterology

## 2017-09-09 ENCOUNTER — Other Ambulatory Visit: Payer: Self-pay | Admitting: Family Medicine

## 2017-09-09 DIAGNOSIS — R1013 Epigastric pain: Secondary | ICD-10-CM

## 2017-09-09 DIAGNOSIS — K219 Gastro-esophageal reflux disease without esophagitis: Secondary | ICD-10-CM

## 2017-09-22 ENCOUNTER — Ambulatory Visit: Payer: Medicare Other

## 2017-09-22 ENCOUNTER — Other Ambulatory Visit: Payer: Self-pay | Admitting: Adult Health

## 2017-09-23 ENCOUNTER — Encounter: Payer: Self-pay | Admitting: *Deleted

## 2017-09-23 ENCOUNTER — Ambulatory Visit (INDEPENDENT_AMBULATORY_CARE_PROVIDER_SITE_OTHER): Payer: Medicare Other | Admitting: *Deleted

## 2017-09-23 ENCOUNTER — Other Ambulatory Visit: Payer: Self-pay

## 2017-09-23 DIAGNOSIS — Z3202 Encounter for pregnancy test, result negative: Secondary | ICD-10-CM | POA: Diagnosis not present

## 2017-09-23 DIAGNOSIS — Z3042 Encounter for surveillance of injectable contraceptive: Secondary | ICD-10-CM | POA: Diagnosis not present

## 2017-09-23 LAB — POCT URINE PREGNANCY: Preg Test, Ur: NEGATIVE

## 2017-09-23 MED ORDER — MEDROXYPROGESTERONE ACETATE 150 MG/ML IM SUSP
150.0000 mg | Freq: Once | INTRAMUSCULAR | Status: AC
  Administered 2017-09-23: 150 mg via INTRAMUSCULAR

## 2017-09-23 NOTE — Progress Notes (Signed)
Pt given DepoProvera 150mg IM right deltoid without complications. Advised to return in 12 weeks for next injection. 

## 2017-09-27 ENCOUNTER — Other Ambulatory Visit: Payer: Self-pay | Admitting: Family Medicine

## 2017-09-27 DIAGNOSIS — F320 Major depressive disorder, single episode, mild: Secondary | ICD-10-CM

## 2017-09-29 ENCOUNTER — Ambulatory Visit (INDEPENDENT_AMBULATORY_CARE_PROVIDER_SITE_OTHER): Payer: Medicare Other | Admitting: Orthopaedic Surgery

## 2017-10-06 ENCOUNTER — Ambulatory Visit (INDEPENDENT_AMBULATORY_CARE_PROVIDER_SITE_OTHER): Payer: Medicare Other | Admitting: Otolaryngology

## 2017-11-10 ENCOUNTER — Ambulatory Visit (INDEPENDENT_AMBULATORY_CARE_PROVIDER_SITE_OTHER): Payer: Medicare Other | Admitting: Otolaryngology

## 2017-11-10 DIAGNOSIS — J343 Hypertrophy of nasal turbinates: Secondary | ICD-10-CM | POA: Diagnosis not present

## 2017-11-10 DIAGNOSIS — R490 Dysphonia: Secondary | ICD-10-CM | POA: Diagnosis not present

## 2017-11-10 DIAGNOSIS — J310 Chronic rhinitis: Secondary | ICD-10-CM | POA: Diagnosis not present

## 2017-11-10 DIAGNOSIS — J342 Deviated nasal septum: Secondary | ICD-10-CM

## 2017-12-09 ENCOUNTER — Telehealth: Payer: Self-pay | Admitting: Gastroenterology

## 2017-12-09 ENCOUNTER — Encounter: Payer: Self-pay | Admitting: Internal Medicine

## 2017-12-09 ENCOUNTER — Ambulatory Visit: Payer: Medicare Other | Admitting: Gastroenterology

## 2017-12-09 NOTE — Telephone Encounter (Signed)
Patient was a no show and letter sent  °

## 2017-12-16 ENCOUNTER — Ambulatory Visit: Payer: Medicare Other

## 2017-12-19 ENCOUNTER — Ambulatory Visit (INDEPENDENT_AMBULATORY_CARE_PROVIDER_SITE_OTHER): Payer: Medicare Other

## 2017-12-19 VITALS — Ht 67.0 in | Wt 254.2 lb

## 2017-12-19 DIAGNOSIS — Z3042 Encounter for surveillance of injectable contraceptive: Secondary | ICD-10-CM

## 2017-12-19 DIAGNOSIS — Z3202 Encounter for pregnancy test, result negative: Secondary | ICD-10-CM | POA: Diagnosis not present

## 2017-12-19 LAB — POCT URINE PREGNANCY: PREG TEST UR: NEGATIVE

## 2017-12-19 MED ORDER — MEDROXYPROGESTERONE ACETATE 150 MG/ML IM SUSP
150.0000 mg | Freq: Once | INTRAMUSCULAR | Status: AC
  Administered 2017-12-19: 150 mg via INTRAMUSCULAR

## 2017-12-19 NOTE — Progress Notes (Signed)
Pt here for depo injection 150 mg IM given lt deltoid. Tolerated well. Return 12 weeks for next injection. Having break through bleeding.will see what this shot does before making appointment about bleeding. Pad CMA

## 2018-01-02 ENCOUNTER — Ambulatory Visit (INDEPENDENT_AMBULATORY_CARE_PROVIDER_SITE_OTHER): Payer: Medicare Other | Admitting: Otolaryngology

## 2018-03-13 ENCOUNTER — Ambulatory Visit: Payer: Self-pay

## 2018-11-13 ENCOUNTER — Other Ambulatory Visit: Payer: Self-pay | Admitting: Adult Health

## 2019-01-29 ENCOUNTER — Other Ambulatory Visit: Payer: Self-pay | Admitting: Adult Health

## 2019-04-17 ENCOUNTER — Other Ambulatory Visit: Payer: Self-pay | Admitting: Adult Health

## 2019-05-26 IMAGING — US US ABDOMEN COMPLETE
1 series · 13 of 25 positions shown · non-contrast
Comparison: CT scan of February 27, 2016.

CLINICAL DATA: Epigastric abdominal pain for 6 months.

EXAM:
ABDOMEN ULTRASOUND COMPLETE

[Series 1: us abdomen complete · 0.22mm/px · 13 of 113 slices shown]
[im 1/113]
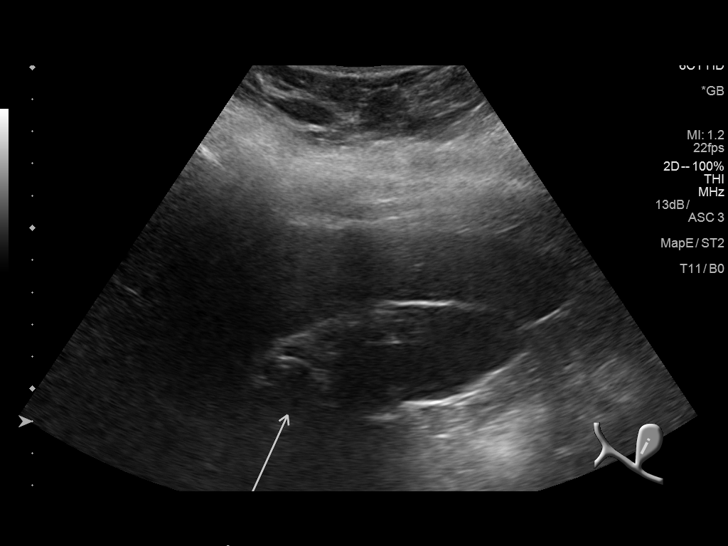
[im 10/113]
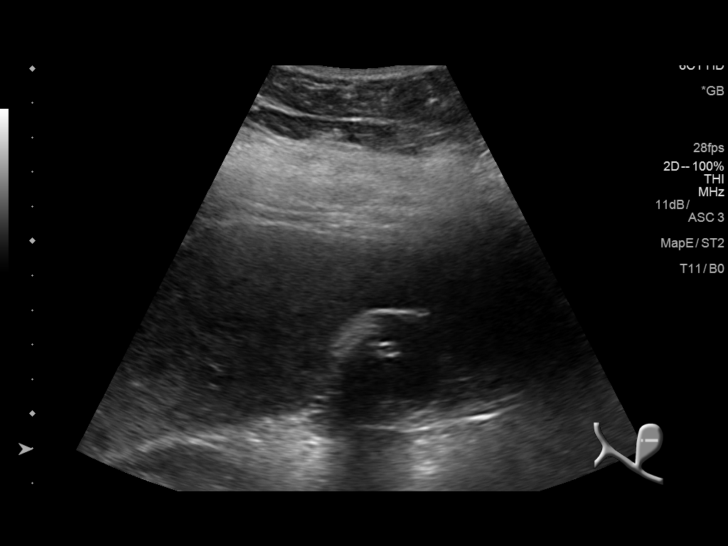
[im 19/113]
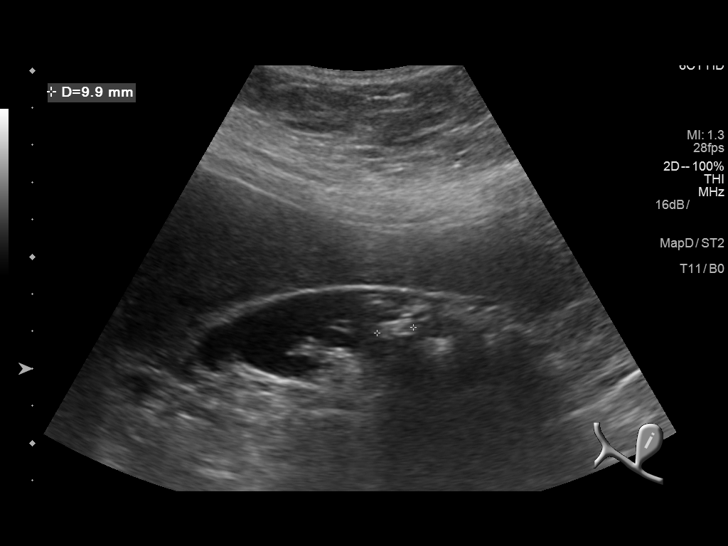
[im 29/113]
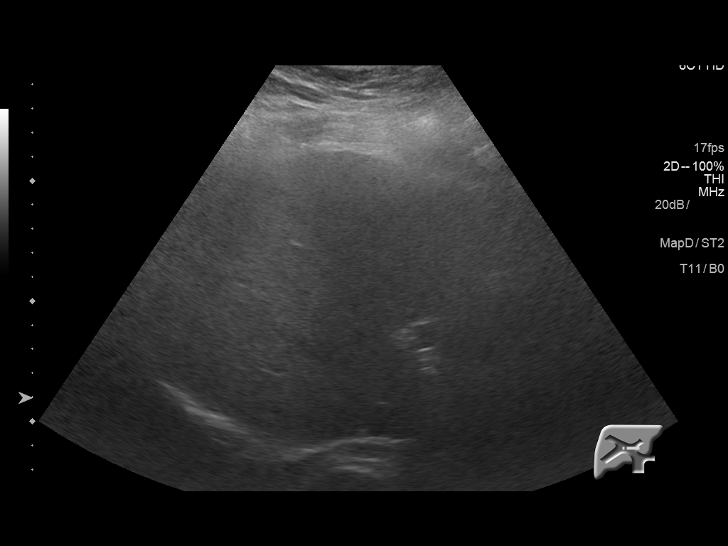
[im 38/113]
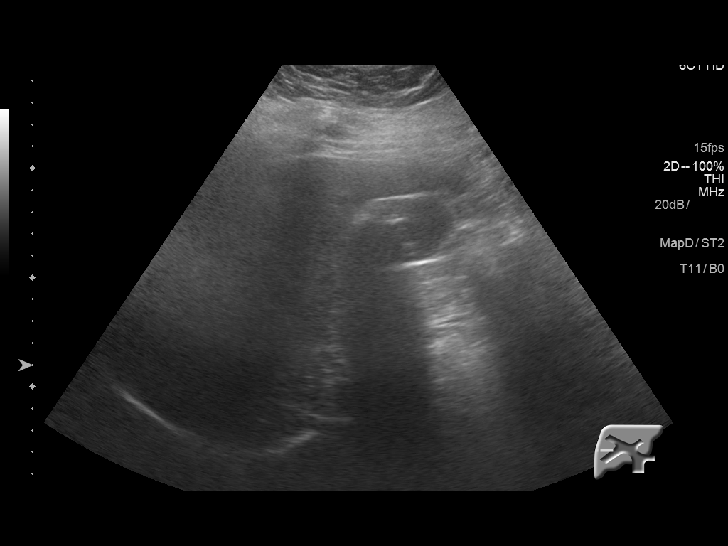
[im 47/113]
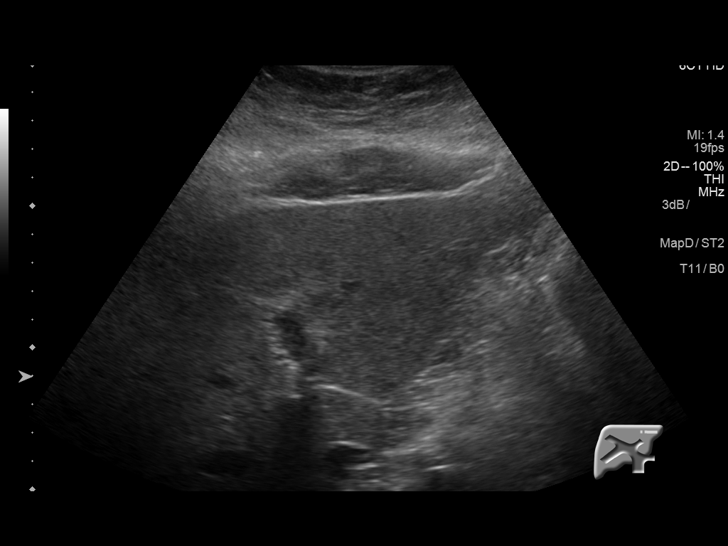
[im 57/113]
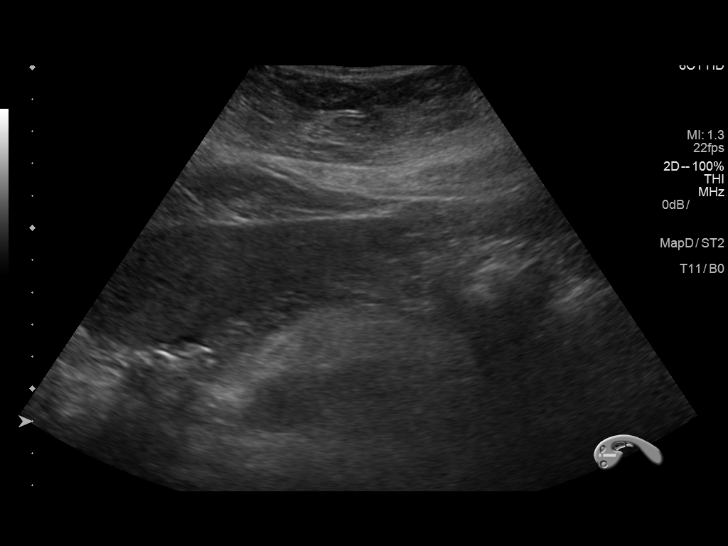
[im 66/113]
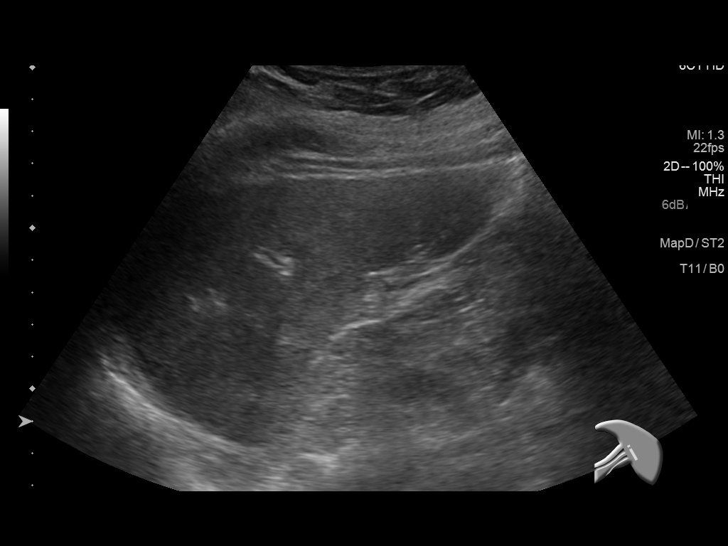
[im 75/113]
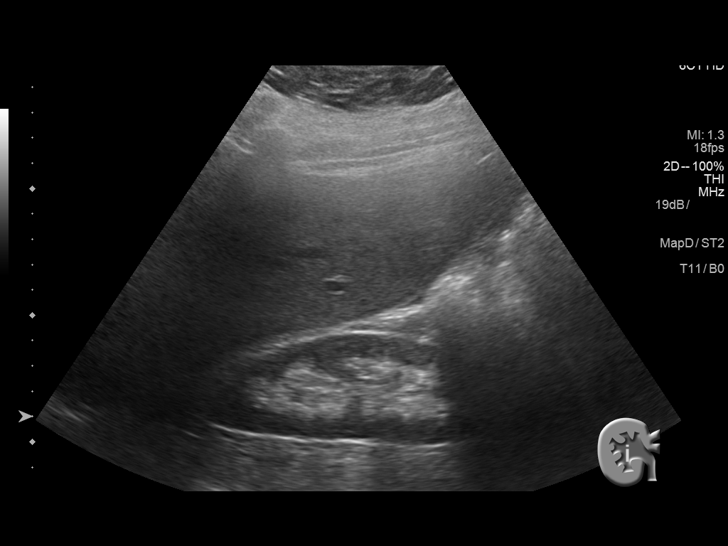
[im 85/113]
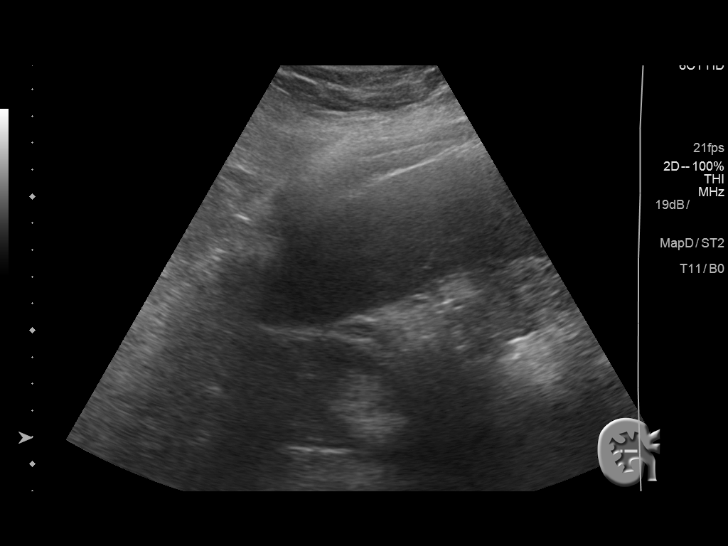
[im 94/113]
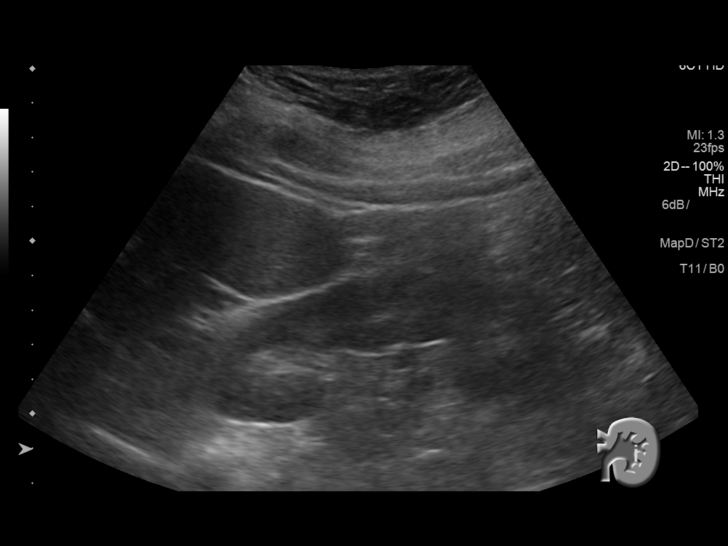
[im 103/113]
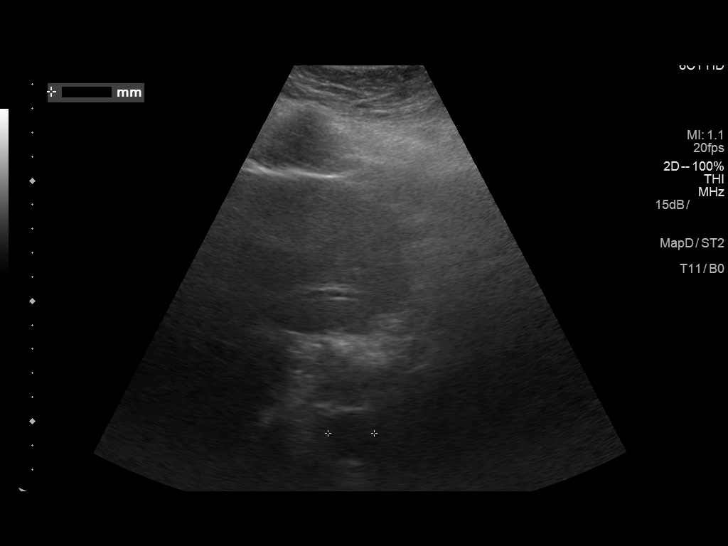
[im 113/113]
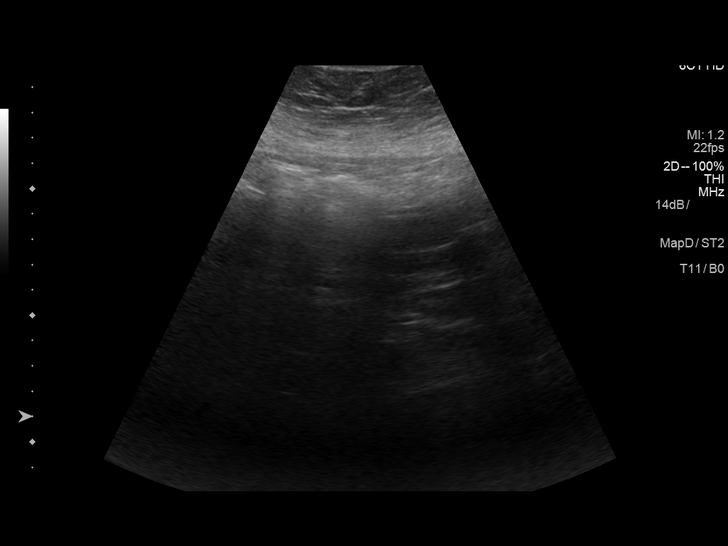

[13 of 25 positions shown; findings below may reference images not displayed]

FINDINGS: Gallbladder: Multiple gallstones are noted with the largest
measuring 1 cm. No gallbladder wall thickening or pericholecystic
fluid is noted. No sonographic Murphy's sign is noted.

Common bile duct: Diameter: 4.3 mm which is within normal limits.

Liver: No focal lesion identified. Increased echogenicity is noted
suggesting fatty infiltration. Portal vein is patent on color
Doppler imaging with normal direction of blood flow towards the
liver.

IVC: No abnormality visualized.

Pancreas: Visualized portion unremarkable.

Spleen: Size and appearance within normal limits.

Right Kidney: Length: 11.8 cm. Echogenicity within normal limits. No
mass or hydronephrosis visualized.

Left Kidney: Length: 12.5 cm. 2.6 cm simple cyst is seen in lower
pole. Echogenicity within normal limits. No mass or hydronephrosis
visualized.

Abdominal aorta: No aneurysm visualized.

Other findings: None.
IMPRESSION: Cholelithiasis without evidence of cholecystitis.

Probable fatty infiltration of the liver.

No other significant abnormality seen in the abdomen.

## 2019-06-08 IMAGING — MG DIGITAL SCREENING BILATERAL MAMMOGRAM WITH TOMO AND CAD
6 of 12 series · 6 of 36 positions shown · non-contrast
Comparison: Previous exam(s).

ACR Breast Density Category a: The breast tissue is almost entirely
fatty.

CLINICAL DATA: Screening.

EXAM:
DIGITAL SCREENING BILATERAL MAMMOGRAM WITH TOMO AND CAD

[L MLO synth-2D (1 of 2)]
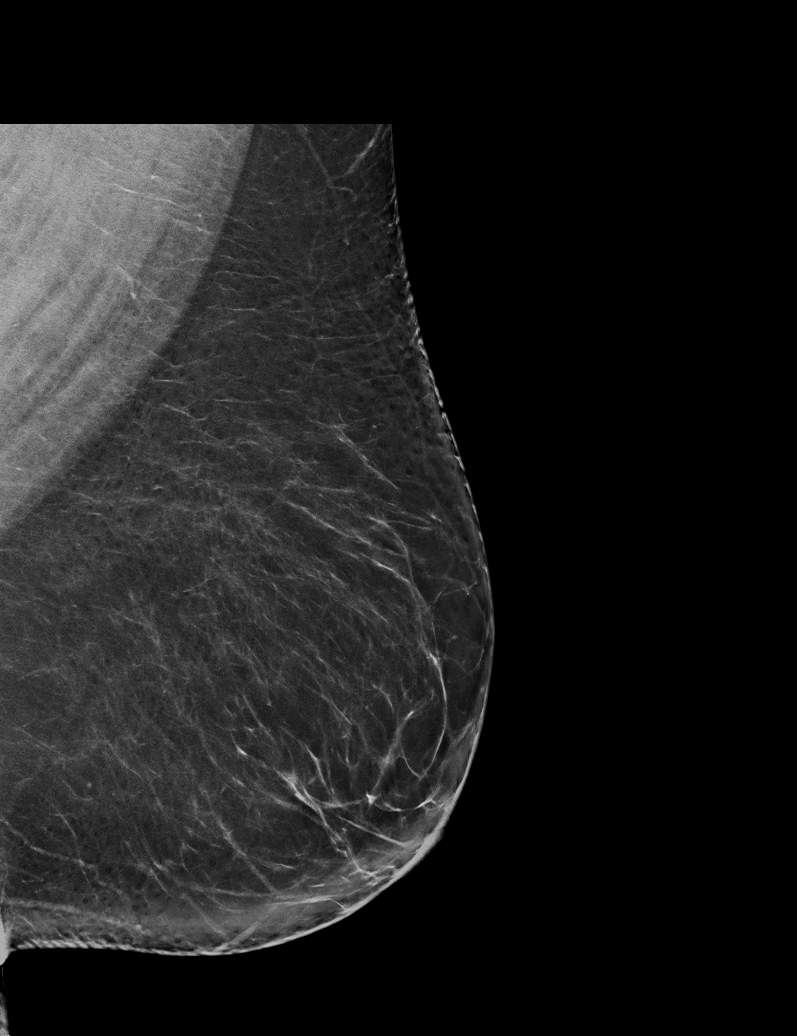

[L MLO synth-2D (2 of 2)]
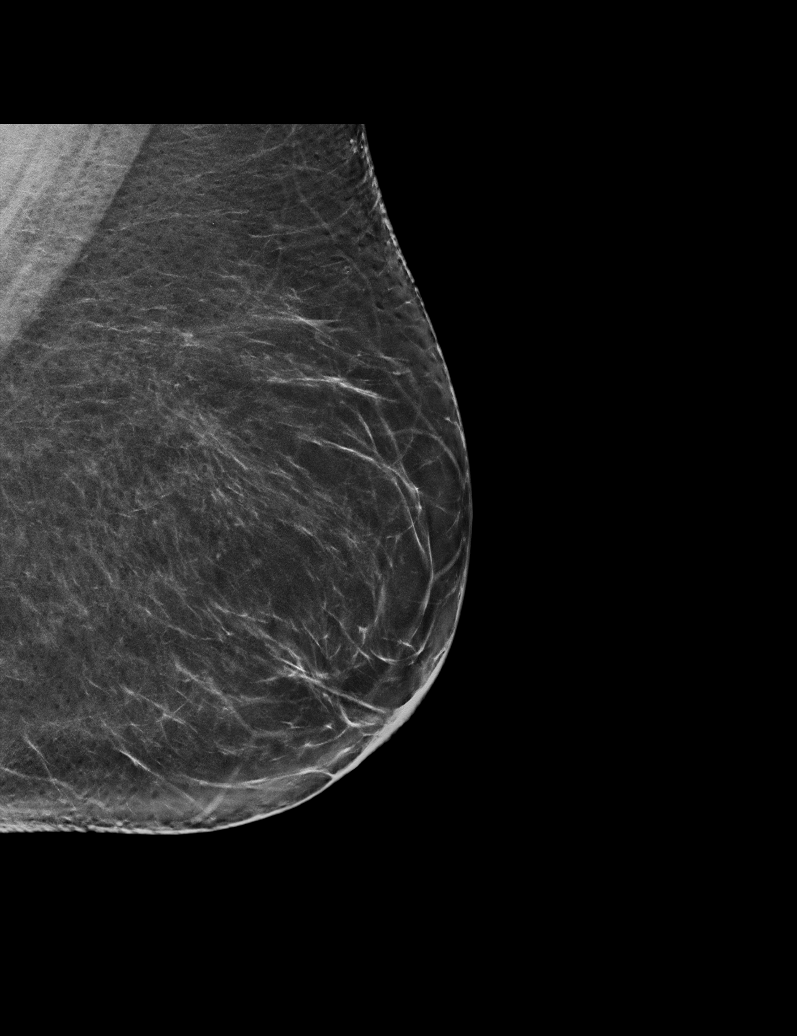

[R MLO synth-2D (1 of 2)]
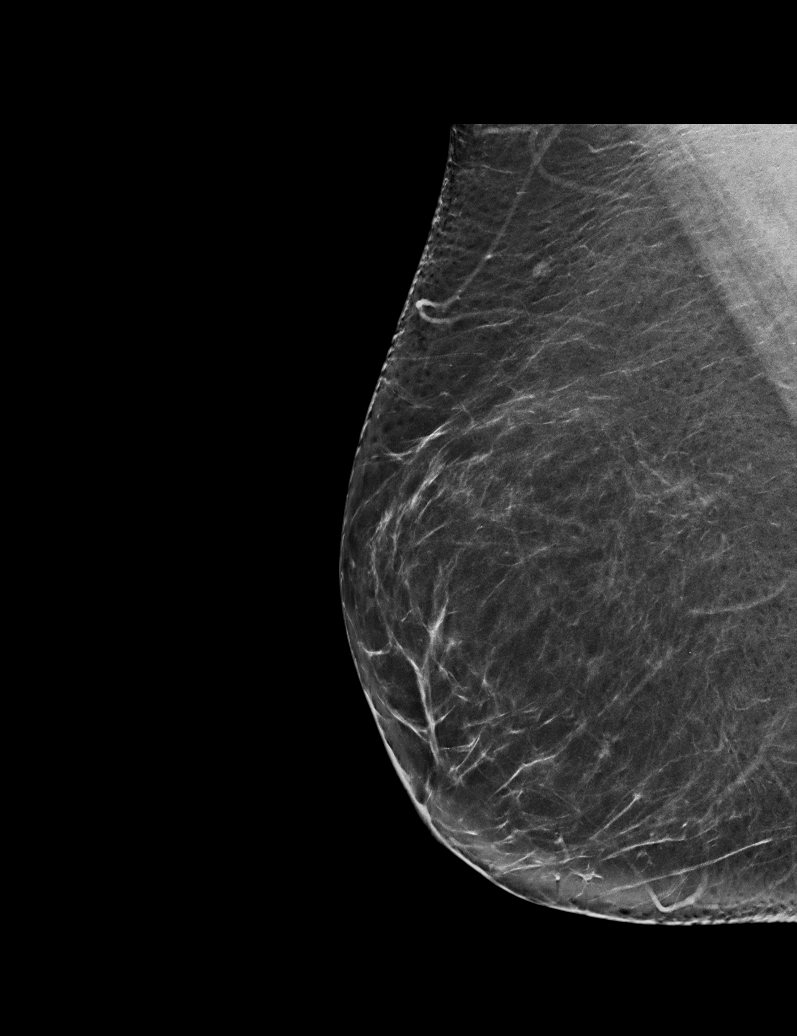

[R CC synth-2D]
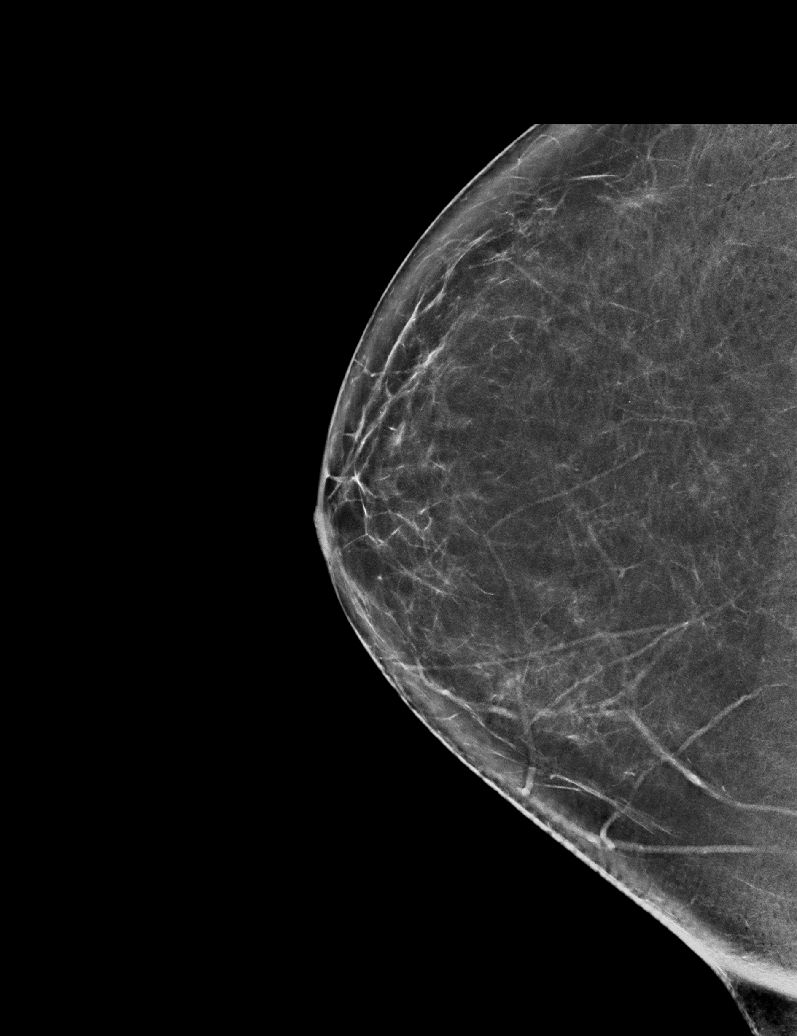

[R MLO synth-2D (2 of 2)]
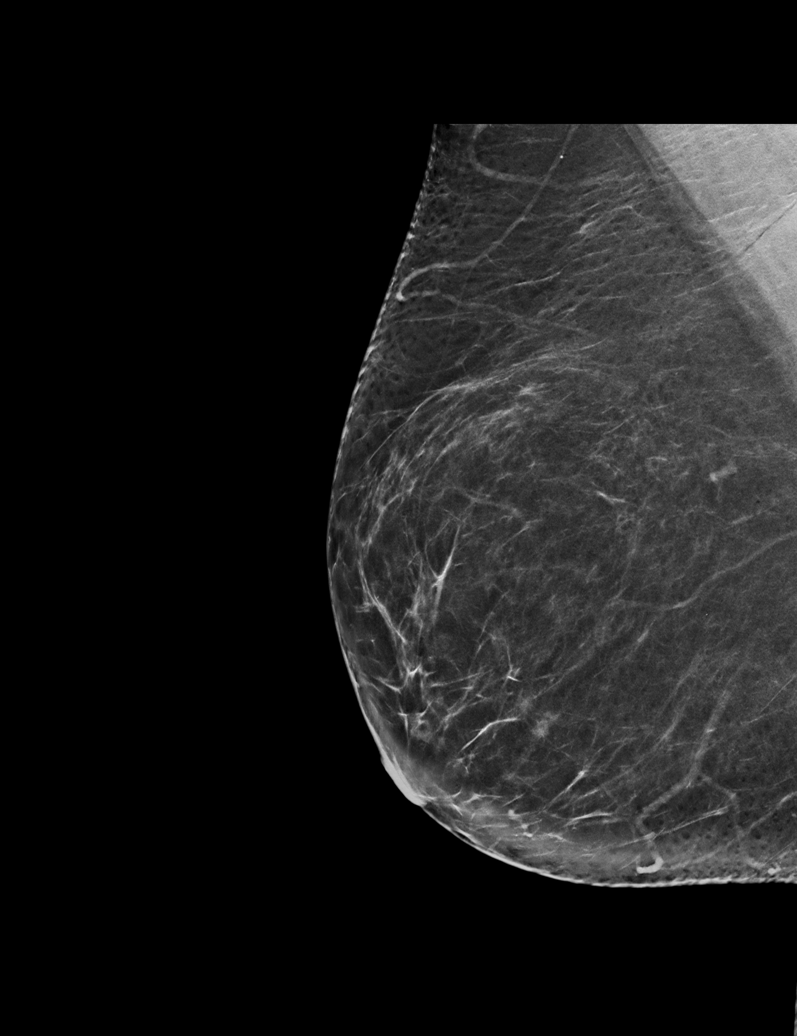

[L CC synth-2D]
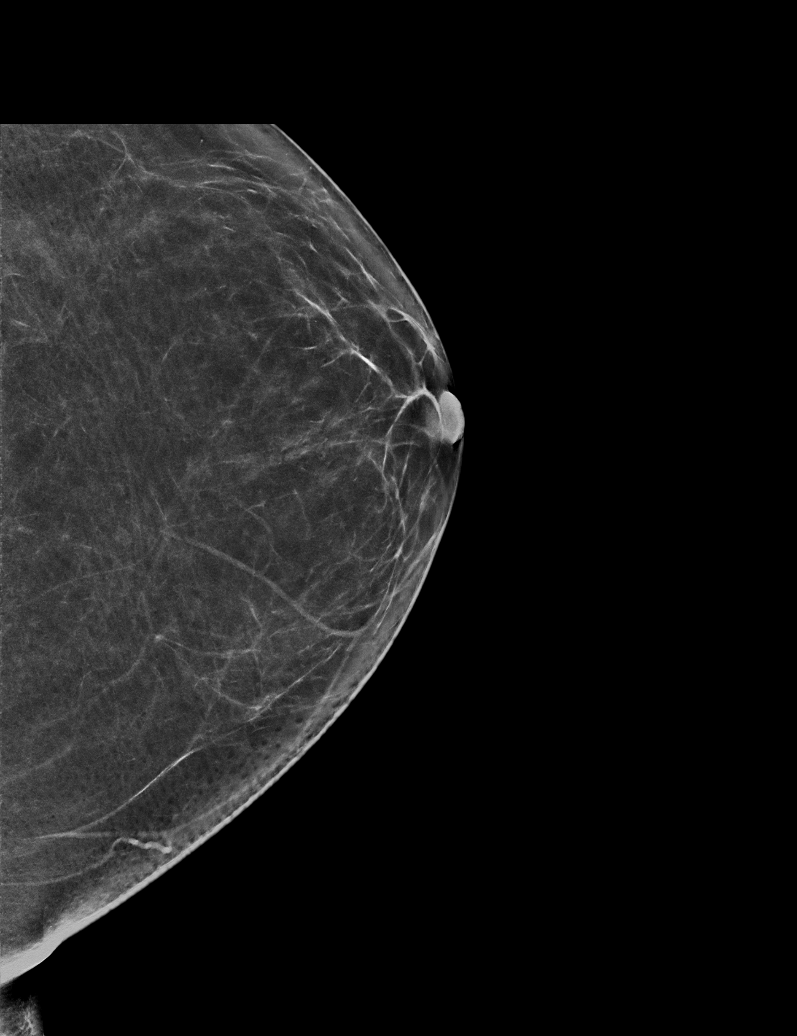

[6 of 36 positions shown; findings below may reference images not displayed]

FINDINGS: There are no findings suspicious for malignancy. Images were
processed with CAD.
IMPRESSION: No mammographic evidence of malignancy. A result letter of this
screening mammogram will be mailed directly to the patient.

RECOMMENDATION:
Screening mammogram in one year. (Code:[id number]-[id number])

BI-RADS CATEGORY  1: Negative.

## 2019-07-20 ENCOUNTER — Encounter: Payer: Self-pay | Admitting: Internal Medicine

## 2019-09-14 ENCOUNTER — Ambulatory Visit: Payer: Medicare Other | Admitting: Gastroenterology

## 2019-09-14 ENCOUNTER — Encounter: Payer: Self-pay | Admitting: Internal Medicine

## 2020-04-17 DIAGNOSIS — M179 Osteoarthritis of knee, unspecified: Secondary | ICD-10-CM | POA: Diagnosis not present

## 2020-04-17 DIAGNOSIS — M6751 Plica syndrome, right knee: Secondary | ICD-10-CM | POA: Diagnosis not present

## 2020-06-26 DIAGNOSIS — L0100 Impetigo, unspecified: Secondary | ICD-10-CM | POA: Diagnosis not present

## 2020-06-26 DIAGNOSIS — T148XXA Other injury of unspecified body region, initial encounter: Secondary | ICD-10-CM | POA: Diagnosis not present

## 2020-07-03 DIAGNOSIS — L0100 Impetigo, unspecified: Secondary | ICD-10-CM | POA: Diagnosis not present

## 2020-07-03 DIAGNOSIS — M179 Osteoarthritis of knee, unspecified: Secondary | ICD-10-CM | POA: Diagnosis not present

## 2020-07-03 DIAGNOSIS — M6751 Plica syndrome, right knee: Secondary | ICD-10-CM | POA: Diagnosis not present

## 2020-07-03 DIAGNOSIS — I10 Essential (primary) hypertension: Secondary | ICD-10-CM | POA: Diagnosis not present

## 2020-08-21 DIAGNOSIS — M25519 Pain in unspecified shoulder: Secondary | ICD-10-CM | POA: Diagnosis not present

## 2020-08-21 DIAGNOSIS — M25562 Pain in left knee: Secondary | ICD-10-CM | POA: Diagnosis not present

## 2020-08-21 DIAGNOSIS — M25512 Pain in left shoulder: Secondary | ICD-10-CM | POA: Diagnosis not present

## 2020-08-21 DIAGNOSIS — R52 Pain, unspecified: Secondary | ICD-10-CM | POA: Diagnosis not present

## 2020-08-21 DIAGNOSIS — S8002XA Contusion of left knee, initial encounter: Secondary | ICD-10-CM | POA: Diagnosis not present

## 2020-08-26 DIAGNOSIS — M898X1 Other specified disorders of bone, shoulder: Secondary | ICD-10-CM | POA: Diagnosis not present

## 2020-09-09 DIAGNOSIS — M25519 Pain in unspecified shoulder: Secondary | ICD-10-CM | POA: Diagnosis not present

## 2020-09-09 DIAGNOSIS — S8002XD Contusion of left knee, subsequent encounter: Secondary | ICD-10-CM | POA: Diagnosis not present

## 2020-09-22 DIAGNOSIS — L0100 Impetigo, unspecified: Secondary | ICD-10-CM | POA: Diagnosis not present

## 2020-09-22 DIAGNOSIS — Z720 Tobacco use: Secondary | ICD-10-CM | POA: Diagnosis not present

## 2020-11-25 DIAGNOSIS — M9903 Segmental and somatic dysfunction of lumbar region: Secondary | ICD-10-CM | POA: Diagnosis not present

## 2020-11-25 DIAGNOSIS — M546 Pain in thoracic spine: Secondary | ICD-10-CM | POA: Diagnosis not present

## 2020-11-25 DIAGNOSIS — M9902 Segmental and somatic dysfunction of thoracic region: Secondary | ICD-10-CM | POA: Diagnosis not present

## 2020-11-25 DIAGNOSIS — M542 Cervicalgia: Secondary | ICD-10-CM | POA: Diagnosis not present

## 2020-11-25 DIAGNOSIS — I10 Essential (primary) hypertension: Secondary | ICD-10-CM | POA: Diagnosis not present

## 2020-11-25 DIAGNOSIS — M62830 Muscle spasm of back: Secondary | ICD-10-CM | POA: Diagnosis not present

## 2020-11-25 DIAGNOSIS — M9901 Segmental and somatic dysfunction of cervical region: Secondary | ICD-10-CM | POA: Diagnosis not present

## 2020-12-01 DIAGNOSIS — M9901 Segmental and somatic dysfunction of cervical region: Secondary | ICD-10-CM | POA: Diagnosis not present

## 2020-12-01 DIAGNOSIS — M542 Cervicalgia: Secondary | ICD-10-CM | POA: Diagnosis not present

## 2020-12-01 DIAGNOSIS — M9903 Segmental and somatic dysfunction of lumbar region: Secondary | ICD-10-CM | POA: Diagnosis not present

## 2020-12-01 DIAGNOSIS — M62830 Muscle spasm of back: Secondary | ICD-10-CM | POA: Diagnosis not present

## 2020-12-01 DIAGNOSIS — M546 Pain in thoracic spine: Secondary | ICD-10-CM | POA: Diagnosis not present

## 2020-12-01 DIAGNOSIS — M9902 Segmental and somatic dysfunction of thoracic region: Secondary | ICD-10-CM | POA: Diagnosis not present

## 2020-12-11 DIAGNOSIS — J329 Chronic sinusitis, unspecified: Secondary | ICD-10-CM | POA: Diagnosis not present

## 2020-12-11 DIAGNOSIS — Z720 Tobacco use: Secondary | ICD-10-CM | POA: Diagnosis not present

## 2020-12-11 DIAGNOSIS — I10 Essential (primary) hypertension: Secondary | ICD-10-CM | POA: Diagnosis not present

## 2020-12-11 DIAGNOSIS — R319 Hematuria, unspecified: Secondary | ICD-10-CM | POA: Diagnosis not present

## 2020-12-11 DIAGNOSIS — N182 Chronic kidney disease, stage 2 (mild): Secondary | ICD-10-CM | POA: Diagnosis not present

## 2020-12-11 DIAGNOSIS — M5440 Lumbago with sciatica, unspecified side: Secondary | ICD-10-CM | POA: Diagnosis not present

## 2020-12-15 DIAGNOSIS — M542 Cervicalgia: Secondary | ICD-10-CM | POA: Diagnosis not present

## 2020-12-15 DIAGNOSIS — M546 Pain in thoracic spine: Secondary | ICD-10-CM | POA: Diagnosis not present

## 2020-12-15 DIAGNOSIS — M62830 Muscle spasm of back: Secondary | ICD-10-CM | POA: Diagnosis not present

## 2020-12-15 DIAGNOSIS — M9901 Segmental and somatic dysfunction of cervical region: Secondary | ICD-10-CM | POA: Diagnosis not present

## 2020-12-15 DIAGNOSIS — M9902 Segmental and somatic dysfunction of thoracic region: Secondary | ICD-10-CM | POA: Diagnosis not present

## 2020-12-15 DIAGNOSIS — M9903 Segmental and somatic dysfunction of lumbar region: Secondary | ICD-10-CM | POA: Diagnosis not present

## 2020-12-25 ENCOUNTER — Encounter: Payer: Self-pay | Admitting: Internal Medicine

## 2021-03-12 DIAGNOSIS — M5440 Lumbago with sciatica, unspecified side: Secondary | ICD-10-CM | POA: Diagnosis not present

## 2021-03-12 DIAGNOSIS — R319 Hematuria, unspecified: Secondary | ICD-10-CM | POA: Diagnosis not present

## 2021-03-12 DIAGNOSIS — Z7182 Exercise counseling: Secondary | ICD-10-CM | POA: Diagnosis not present

## 2021-03-12 DIAGNOSIS — Z79899 Other long term (current) drug therapy: Secondary | ICD-10-CM | POA: Diagnosis not present

## 2021-03-12 DIAGNOSIS — E559 Vitamin D deficiency, unspecified: Secondary | ICD-10-CM | POA: Diagnosis not present

## 2021-03-12 DIAGNOSIS — I10 Essential (primary) hypertension: Secondary | ICD-10-CM | POA: Diagnosis not present

## 2021-03-12 DIAGNOSIS — Z720 Tobacco use: Secondary | ICD-10-CM | POA: Diagnosis not present

## 2021-03-12 DIAGNOSIS — Z713 Dietary counseling and surveillance: Secondary | ICD-10-CM | POA: Diagnosis not present

## 2021-03-12 DIAGNOSIS — N182 Chronic kidney disease, stage 2 (mild): Secondary | ICD-10-CM | POA: Diagnosis not present

## 2021-03-12 DIAGNOSIS — J329 Chronic sinusitis, unspecified: Secondary | ICD-10-CM | POA: Diagnosis not present

## 2021-04-02 ENCOUNTER — Ambulatory Visit (HOSPITAL_COMMUNITY)
Admission: RE | Admit: 2021-04-02 | Discharge: 2021-04-02 | Disposition: A | Discharge status: Home / Self Care | Payer: Medicare Other | Source: Ambulatory Visit | Attending: Neurology | Admitting: Neurology

## 2021-04-02 ENCOUNTER — Other Ambulatory Visit (HOSPITAL_COMMUNITY): Payer: Self-pay | Admitting: Neurology

## 2021-04-02 DIAGNOSIS — M5116 Intervertebral disc disorders with radiculopathy, lumbar region: Secondary | ICD-10-CM

## 2021-04-02 DIAGNOSIS — M545 Low back pain, unspecified: Secondary | ICD-10-CM

## 2021-04-02 DIAGNOSIS — M5412 Radiculopathy, cervical region: Secondary | ICD-10-CM | POA: Diagnosis not present

## 2021-04-02 DIAGNOSIS — M25511 Pain in right shoulder: Secondary | ICD-10-CM | POA: Diagnosis not present

## 2021-04-02 DIAGNOSIS — M542 Cervicalgia: Secondary | ICD-10-CM | POA: Diagnosis not present

## 2021-04-02 DIAGNOSIS — G5601 Carpal tunnel syndrome, right upper limb: Secondary | ICD-10-CM | POA: Diagnosis not present

## 2021-04-02 DIAGNOSIS — M25551 Pain in right hip: Secondary | ICD-10-CM | POA: Diagnosis not present

## 2021-04-02 DIAGNOSIS — M79602 Pain in left arm: Secondary | ICD-10-CM | POA: Diagnosis not present

## 2021-04-02 DIAGNOSIS — M25552 Pain in left hip: Secondary | ICD-10-CM | POA: Diagnosis not present

## 2021-04-02 DIAGNOSIS — M79601 Pain in right arm: Secondary | ICD-10-CM | POA: Diagnosis not present

## 2021-04-02 DIAGNOSIS — M5416 Radiculopathy, lumbar region: Secondary | ICD-10-CM | POA: Diagnosis not present

## 2021-04-02 DIAGNOSIS — G5602 Carpal tunnel syndrome, left upper limb: Secondary | ICD-10-CM | POA: Diagnosis not present

## 2021-04-20 NOTE — Progress Notes (Addendum)
? ? ? ?GI Office Note   ? ?Referring Provider: Leonie Douglas, MD ?Primary Care Physician:  Leonie Douglas, MD  ?Primary Gastroenterologist: Elon Alas. Abbey Chatters, DO ? ?Chief Complaint  ? ?Chief Complaint  ?Patient presents with  ? Bloated  ?  Swells up after she eats.   ? Abdominal Pain  ?  Sharp stabbing pains when she leans over.   ? ? ? ?History of Present Illness  ? ?Brooke [name] is a 46 y.o. female presenting today at the request of Dr. Marlon Pel for further evaluation of constipation, abdominal pain, GERD. ? ?Patient has never seen a GI before. No prior EGD/colonoscopy. She has issues with abdominal pain/bloating for over a year. She notes upper abdominal  pain with prolonged periods, 30 minutes or more. If she leans over on a grocery cart or the kitchen counter and touches upper abdomen, then feels knife like pain in the mid-abdomen above the umbilicus. Just pressing on upper abdomen does not bother her. She also complains of postprandial bloating all day regardless of food intake. Bloating for more than one year. She has had yoyo with weight, does not bloating seems to be less at lower weight but still a lot of it is related to her meals. Bloating better in the mornings before meals. Progresses throughout the days as she eats. BM doing ok. Usually BM every morning. Usually 2-3 per day. Some intermittent constipation. Only takes linzess when really needed, maybe 2-3 times per month. Usually cleans system out when she does take it and could not tolerate ever day. On acid reflux medication for more than six years. Heartburn well controlled for the most part. Some intermittent "rotten egg" belches. No dysphagia.  Toilet tissue hematachezia rarely. No melena.  ? ?Currently on short term prednisone for joint issues. Denies ASA and NSAIDs. States she has had labs recently which showed high cholesterol. Scheduled for MRI lumbar spine today.  ? ?This imaging includes abdominal ultrasound October 2018 showing  cholelithiasis, probable fatty liver.  She also had a CT abdomen pelvis with contrast in February 2020 ? ?Medications  ? ?Current Outpatient Medications  ?Medication Sig Dispense Refill  ? atorvastatin (LIPITOR) 20 MG tablet Take 20 mg by mouth daily.    ? Cyanocobalamin (VITAMIN B-12 PO) Take by mouth 2 (two) times daily.    ? DULoxetine (CYMBALTA) 60 MG capsule TAKE 1 CAPSULE BY MOUTH ONCE DAILY. 30 capsule 3  ? esomeprazole (NEXIUM) 40 MG capsule TAKE 1 CAPSULE DAILY AT 12 NOON. 30 capsule 3  ? linaclotide (LINZESS) 145 MCG CAPS capsule Take one a day as needed 30 capsule 0  ? medroxyPROGESTERone (DEPO-PROVERA) 150 MG/ML injection INJECT 1ML INTRAMUSCULARLY EVERY 3 MONTHS. 1 mL 0  ? methocarbamol (ROBAXIN) 500 MG tablet Take 1 tablet (500 mg total) by mouth 4 (four) times daily. 20 tablet 0  ? mometasone (NASONEX) 50 MCG/ACT nasal spray Place 2 sprays into the nose daily. 17 g 12  ? SUMAtriptan (IMITREX) 100 MG tablet Take 1 tablet (100 mg total) by mouth every 2 (two) hours as needed for migraine. May repeat in 2 hours if headache persists or recurs. 9 tablet 3  ? Vitamin D, Ergocalciferol, (DRISDOL) 50000 units CAPS capsule Take 1 capsule (50,000 Units total) by mouth every 7 (seven) days. 12 capsule 1  ? lisinopril (ZESTRIL) 5 MG tablet Take 5 mg by mouth daily.    ? predniSONE (DELTASONE) 20 MG tablet Take by mouth.    ? ?No current facility-administered medications for  this visit.  ? ? ?Allergies  ? ?Allergies as of 04/21/2021 - Review Complete 04/21/2021  ?Allergen Reaction Noted  ? Ciprofloxacin Itching and Swelling 11/04/2014  ? Other Other (See Comments) 11/04/2014  ? Sulfa antibiotics Swelling 04/02/2014  ? Zithromax [azithromycin] Itching and Swelling 02/26/2016  ? ? ?Past Medical History  ? ?Past Medical History:  ?Diagnosis Date  ? Disc degeneration, lumbar   ? Fibromyalgia   ? GERD (gastroesophageal reflux disease)   ? Hypertension   ? Insomnia   ? Knee pain   ? Migraines   ? Nerve damage   ?  Neuropathy   ? Overweight   ? Sinusitis, chronic   ? Tendonitis   ? ? ?Past Surgical History  ? ?Past Surgical History:  ?Procedure Laterality Date  ? MOUTH SURGERY    ? dental surgery /tooth extraction  ? ? ?Past Family History  ? ?Family History  ?Problem Relation Age of Onset  ? Hypertension Mother   ? Fibromyalgia Mother   ? Neuropathy Mother   ? Thyroid cancer Mother   ? Hypertension Father   ? Cancer Father   ?     ?lung  ? Hypertension Sister   ? Fibromyalgia Sister   ? Neuropathy Sister   ? Colon cancer Neg Hx   ? Inflammatory bowel disease Neg Hx   ? Celiac disease Neg Hx   ? ? ?Past Social History  ? ?Social History  ? ?Socioeconomic History  ? Marital status: Single  ?  Spouse name: Not on file  ? Number of children: 2  ? Years of education: Not on file  ? Highest education level: Not on file  ?Occupational History  ? Not on file  ?Tobacco Use  ? Smoking status: Every Day  ?  Packs/day: 1.00  ?  Years: 24.00  ?  Pack years: 24.00  ?  Types: Cigarettes  ? Smokeless tobacco: Never  ?Vaping Use  ? Vaping Use: Some days  ?Substance and Sexual Activity  ? Alcohol use: No  ? Drug use: No  ? Sexual activity: Yes  ?  Birth control/protection: Injection  ?Other Topics Concern  ? Not on file  ?Social History Narrative  ? Engaged to get married. Two children, age 58 and 13. They go to school. Does not work outside the house. Lives in [place]. Eats all food groups. Exercises minimally. Has another child that lives in [place] with his father.   ? ?Social Determinants of Health  ? ?Financial Resource Strain: Not on file  ?Food Insecurity: Not on file  ?Transportation Needs: Not on file  ?Physical Activity: Not on file  ?Stress: Not on file  ?Social Connections: Not on file  ?Intimate Partner Violence: Not on file  ? ? ?Review of Systems  ? ?General: Negative for anorexia, unintentional weight loss, fever, chills, fatigue, weakness. ?Eyes: Negative for vision changes.  ?ENT: Negative for hoarseness, difficulty swallowing ,  nasal congestion. ?CV: Negative for chest pain, angina, palpitations, dyspnea on exertion, peripheral edema.  ?Respiratory: Negative for dyspnea at rest, dyspnea on exertion, cough, sputum, wheezing.  ?GI: See history of present illness. ?GU:  Negative for dysuria, hematuria, urinary incontinence, urinary frequency, nocturnal urination.  ?MS: positive for joint pain, low back pain.  ?Derm: Negative for rash or itching.  ?Neuro: Negative for weakness, abnormal sensation, seizure, frequent headaches, memory loss,  ?confusion.  ?Psych: Negative for anxiety, depression, suicidal ideation, hallucinations.  ?Endo: Negative for unusual weight change.  ?Heme: Negative for bruising or  bleeding. ?Allergy: Negative for rash or hives. ? ?Physical Exam  ? ?BP 138/82 (BP Location: Left Arm, Patient Position: Sitting, Cuff Size: Large)   Pulse 92   Temp 97.8 ?F (36.6 ?C) (Temporal)   Ht '5\' 7"'$  (1.702 m)   Wt 242 lb 9.6 oz (110 kg)   LMP 03/21/2021 (Approximate)   SpO2 98%   BMI 38.00 kg/m?  ?  ?General: Well-nourished, well-developed in no acute distress.  ?Head: Normocephalic, atraumatic.   ?Eyes: Conjunctiva pink, no icterus. ?Mouth: Oropharyngeal mucosa moist and pink , no lesions erythema or exudate. ?Neck: Supple without thyromegaly, masses, or lymphadenopathy.  ?Lungs: Clear to auscultation bilaterally.  ?Heart: Regular rate and rhythm, no murmurs rubs or gallops.  ?Abdomen: Bowel sounds are normal, nontender, nondistended, no hepatosplenomegaly or masses,  ?no abdominal bruits or hernia, no rebound or guarding.   ?Rectal: not performed ?Extremities: No lower extremity edema. No clubbing or deformities.  ?Neuro: Alert and oriented x 4 , grossly normal neurologically.  ?Skin: Warm and dry, no rash or jaundice.   ?Psych: Alert and cooperative, normal mood and affect. ? ?Labs  ? ?Requested ? ?Imaging Studies  ? ?DG Lumbar Spine Complete ? ?Result Date: 04/03/2021 ?CLINICAL DATA:  Low back pain. EXAM: LUMBAR SPINE -  COMPLETE 4+ VIEW COMPARISON:  CT abdomen and pelvis 02/27/2016. FINDINGS: There is no evidence of lumbar spine fracture. Alignment is normal. There is moderate disc space narrowing and endplate osteophyte formation at L

## 2021-04-21 ENCOUNTER — Encounter: Payer: Self-pay | Admitting: Gastroenterology

## 2021-04-21 ENCOUNTER — Ambulatory Visit (INDEPENDENT_AMBULATORY_CARE_PROVIDER_SITE_OTHER): Payer: Medicare Other | Admitting: Gastroenterology

## 2021-04-21 ENCOUNTER — Ambulatory Visit (HOSPITAL_COMMUNITY): Payer: Medicare Other

## 2021-04-21 ENCOUNTER — Telehealth: Payer: Self-pay | Admitting: Gastroenterology

## 2021-04-21 VITALS — BP 138/82 | HR 92 | Temp 97.8°F | Ht 67.0 in | Wt 242.6 lb

## 2021-04-21 DIAGNOSIS — R1010 Upper abdominal pain, unspecified: Secondary | ICD-10-CM | POA: Diagnosis not present

## 2021-04-21 DIAGNOSIS — K625 Hemorrhage of anus and rectum: Secondary | ICD-10-CM | POA: Diagnosis not present

## 2021-04-21 DIAGNOSIS — R1084 Generalized abdominal pain: Secondary | ICD-10-CM | POA: Insufficient documentation

## 2021-04-21 DIAGNOSIS — K219 Gastro-esophageal reflux disease without esophagitis: Secondary | ICD-10-CM | POA: Diagnosis not present

## 2021-04-21 DIAGNOSIS — R140 Abdominal distension (gaseous): Secondary | ICD-10-CM | POA: Diagnosis not present

## 2021-04-21 DIAGNOSIS — R109 Unspecified abdominal pain: Secondary | ICD-10-CM | POA: Insufficient documentation

## 2021-04-21 HISTORY — DX: Hemorrhage of anus and rectum: K62.5

## 2021-04-21 NOTE — Telephone Encounter (Signed)
Lmom for pt to return my call.  

## 2021-04-21 NOTE — Telephone Encounter (Signed)
Please let pt know that I reviewed labs from PCP. Extensive evaluation done. Only thing not checked was celiac labs.  ? ?If she is willing, let's arrange for TTG, IgA and serum IgA. Dx: bloating, abd pain ?

## 2021-04-21 NOTE — Patient Instructions (Addendum)
Plan for upper endoscopy and colonoscopy. See separate instructions.  ?Continue Nexium and Linzess as before.  ?We will review copy of your recent labs and let you know if additional labs are necessary. ?

## 2021-04-21 NOTE — Progress Notes (Signed)
Received copy of labs from PCP dated March 12, 2021: Triglycerides 209, total cholesterol 227, LDL 157, HDL 33, TSH 3.66, glucose 92, BUN 13, creatinine 1.07, sodium 137, potassium 4.9, albumin 4.3, total bilirubin 0.4, alkaline phosphatase 90, AST 15, ALT 18, white blood cell count 8000, hemoglobin 16.7, hematocrit 48.7, MCV 96.8, platelets 265,000.  A1c 5.1.  Vitamin D 23. ? ? ? ? ? ? ? ? ?

## 2021-04-22 NOTE — Telephone Encounter (Signed)
Lmom for pt to return my call.  

## 2021-04-23 NOTE — Telephone Encounter (Signed)
No ans, vm full.  

## 2021-04-27 NOTE — Telephone Encounter (Signed)
No ans, vm full. Mailing letter to pt to have her contact office.  ?

## 2021-04-28 ENCOUNTER — Other Ambulatory Visit (HOSPITAL_COMMUNITY): Payer: Self-pay | Admitting: Neurology

## 2021-04-28 DIAGNOSIS — M545 Low back pain, unspecified: Secondary | ICD-10-CM

## 2021-05-07 ENCOUNTER — Ambulatory Visit (HOSPITAL_COMMUNITY)
Admission: RE | Admit: 2021-05-07 | Discharge: 2021-05-07 | Disposition: A | Discharge status: Home / Self Care | Payer: Medicare Other | Source: Ambulatory Visit | Attending: Neurology | Admitting: Neurology

## 2021-05-07 DIAGNOSIS — M5116 Intervertebral disc disorders with radiculopathy, lumbar region: Secondary | ICD-10-CM | POA: Diagnosis not present

## 2021-05-07 DIAGNOSIS — M47816 Spondylosis without myelopathy or radiculopathy, lumbar region: Secondary | ICD-10-CM | POA: Diagnosis not present

## 2021-05-07 DIAGNOSIS — M5126 Other intervertebral disc displacement, lumbar region: Secondary | ICD-10-CM | POA: Diagnosis not present

## 2021-05-07 DIAGNOSIS — M48061 Spinal stenosis, lumbar region without neurogenic claudication: Secondary | ICD-10-CM | POA: Diagnosis not present

## 2021-05-14 ENCOUNTER — Telehealth: Payer: Self-pay | Admitting: *Deleted

## 2021-05-14 DIAGNOSIS — G5602 Carpal tunnel syndrome, left upper limb: Secondary | ICD-10-CM | POA: Diagnosis not present

## 2021-05-14 DIAGNOSIS — Z79891 Long term (current) use of opiate analgesic: Secondary | ICD-10-CM | POA: Diagnosis not present

## 2021-05-14 DIAGNOSIS — M542 Cervicalgia: Secondary | ICD-10-CM | POA: Diagnosis not present

## 2021-05-14 DIAGNOSIS — M5416 Radiculopathy, lumbar region: Secondary | ICD-10-CM | POA: Diagnosis not present

## 2021-05-14 DIAGNOSIS — M5412 Radiculopathy, cervical region: Secondary | ICD-10-CM | POA: Diagnosis not present

## 2021-05-14 DIAGNOSIS — G5601 Carpal tunnel syndrome, right upper limb: Secondary | ICD-10-CM | POA: Diagnosis not present

## 2021-05-14 DIAGNOSIS — M25511 Pain in right shoulder: Secondary | ICD-10-CM | POA: Diagnosis not present

## 2021-05-14 DIAGNOSIS — M47817 Spondylosis without myelopathy or radiculopathy, lumbosacral region: Secondary | ICD-10-CM | POA: Diagnosis not present

## 2021-05-14 DIAGNOSIS — M545 Low back pain, unspecified: Secondary | ICD-10-CM | POA: Diagnosis not present

## 2021-05-14 DIAGNOSIS — M79601 Pain in right arm: Secondary | ICD-10-CM | POA: Diagnosis not present

## 2021-05-14 DIAGNOSIS — M79602 Pain in left arm: Secondary | ICD-10-CM | POA: Diagnosis not present

## 2021-05-14 NOTE — Telephone Encounter (Signed)
LMOVM for pt to call back to schedule TCS/EGD asa 2, needs preg test with either provider ?

## 2021-06-03 DIAGNOSIS — L0100 Impetigo, unspecified: Secondary | ICD-10-CM | POA: Diagnosis not present

## 2021-06-03 DIAGNOSIS — L989 Disorder of the skin and subcutaneous tissue, unspecified: Secondary | ICD-10-CM | POA: Diagnosis not present

## 2021-06-03 DIAGNOSIS — Z1231 Encounter for screening mammogram for malignant neoplasm of breast: Secondary | ICD-10-CM | POA: Diagnosis not present

## 2021-06-03 DIAGNOSIS — Z7182 Exercise counseling: Secondary | ICD-10-CM | POA: Diagnosis not present

## 2021-06-03 DIAGNOSIS — Z713 Dietary counseling and surveillance: Secondary | ICD-10-CM | POA: Diagnosis not present

## 2021-10-15 DIAGNOSIS — M5136 Other intervertebral disc degeneration, lumbar region: Secondary | ICD-10-CM | POA: Diagnosis not present

## 2021-10-15 DIAGNOSIS — Z7182 Exercise counseling: Secondary | ICD-10-CM | POA: Diagnosis not present

## 2021-10-15 DIAGNOSIS — N182 Chronic kidney disease, stage 2 (mild): Secondary | ICD-10-CM | POA: Diagnosis not present

## 2021-10-15 DIAGNOSIS — Z713 Dietary counseling and surveillance: Secondary | ICD-10-CM | POA: Diagnosis not present

## 2021-10-15 DIAGNOSIS — J329 Chronic sinusitis, unspecified: Secondary | ICD-10-CM | POA: Diagnosis not present

## 2021-10-15 DIAGNOSIS — Z720 Tobacco use: Secondary | ICD-10-CM | POA: Diagnosis not present

## 2021-10-15 DIAGNOSIS — Z79899 Other long term (current) drug therapy: Secondary | ICD-10-CM | POA: Diagnosis not present

## 2021-10-15 DIAGNOSIS — M47816 Spondylosis without myelopathy or radiculopathy, lumbar region: Secondary | ICD-10-CM | POA: Diagnosis not present

## 2021-12-03 DIAGNOSIS — M25511 Pain in right shoulder: Secondary | ICD-10-CM | POA: Diagnosis not present

## 2021-12-03 DIAGNOSIS — M545 Low back pain, unspecified: Secondary | ICD-10-CM | POA: Diagnosis not present

## 2021-12-03 DIAGNOSIS — M79601 Pain in right arm: Secondary | ICD-10-CM | POA: Diagnosis not present

## 2021-12-03 DIAGNOSIS — Z79891 Long term (current) use of opiate analgesic: Secondary | ICD-10-CM | POA: Diagnosis not present

## 2021-12-03 DIAGNOSIS — M5412 Radiculopathy, cervical region: Secondary | ICD-10-CM | POA: Diagnosis not present

## 2021-12-03 DIAGNOSIS — M47817 Spondylosis without myelopathy or radiculopathy, lumbosacral region: Secondary | ICD-10-CM | POA: Diagnosis not present

## 2021-12-03 DIAGNOSIS — M79602 Pain in left arm: Secondary | ICD-10-CM | POA: Diagnosis not present

## 2021-12-03 DIAGNOSIS — G5602 Carpal tunnel syndrome, left upper limb: Secondary | ICD-10-CM | POA: Diagnosis not present

## 2021-12-03 DIAGNOSIS — M4306 Spondylolysis, lumbar region: Secondary | ICD-10-CM | POA: Diagnosis not present

## 2021-12-03 DIAGNOSIS — G5601 Carpal tunnel syndrome, right upper limb: Secondary | ICD-10-CM | POA: Diagnosis not present

## 2021-12-03 DIAGNOSIS — M542 Cervicalgia: Secondary | ICD-10-CM | POA: Diagnosis not present

## 2022-01-20 DIAGNOSIS — J329 Chronic sinusitis, unspecified: Secondary | ICD-10-CM | POA: Diagnosis not present

## 2022-02-22 ENCOUNTER — Ambulatory Visit (INDEPENDENT_AMBULATORY_CARE_PROVIDER_SITE_OTHER): Payer: 59 | Admitting: Internal Medicine

## 2022-02-22 ENCOUNTER — Encounter: Payer: Self-pay | Admitting: Internal Medicine

## 2022-02-22 VITALS — BP 143/92 | HR 98 | Ht 66.0 in | Wt 256.2 lb

## 2022-02-22 DIAGNOSIS — K581 Irritable bowel syndrome with constipation: Secondary | ICD-10-CM

## 2022-02-22 DIAGNOSIS — M5416 Radiculopathy, lumbar region: Secondary | ICD-10-CM

## 2022-02-22 DIAGNOSIS — K219 Gastro-esophageal reflux disease without esophagitis: Secondary | ICD-10-CM | POA: Diagnosis not present

## 2022-02-22 DIAGNOSIS — E538 Deficiency of other specified B group vitamins: Secondary | ICD-10-CM | POA: Diagnosis not present

## 2022-02-22 DIAGNOSIS — K589 Irritable bowel syndrome without diarrhea: Secondary | ICD-10-CM | POA: Insufficient documentation

## 2022-02-22 DIAGNOSIS — Z2821 Immunization not carried out because of patient refusal: Secondary | ICD-10-CM | POA: Diagnosis not present

## 2022-02-22 DIAGNOSIS — F17200 Nicotine dependence, unspecified, uncomplicated: Secondary | ICD-10-CM

## 2022-02-22 DIAGNOSIS — Z0001 Encounter for general adult medical examination with abnormal findings: Secondary | ICD-10-CM

## 2022-02-22 DIAGNOSIS — M5412 Radiculopathy, cervical region: Secondary | ICD-10-CM

## 2022-02-22 DIAGNOSIS — E559 Vitamin D deficiency, unspecified: Secondary | ICD-10-CM | POA: Diagnosis not present

## 2022-02-22 DIAGNOSIS — Z1331 Encounter for screening for depression: Secondary | ICD-10-CM

## 2022-02-22 DIAGNOSIS — M7918 Myalgia, other site: Secondary | ICD-10-CM | POA: Diagnosis not present

## 2022-02-22 DIAGNOSIS — Z1211 Encounter for screening for malignant neoplasm of colon: Secondary | ICD-10-CM

## 2022-02-22 DIAGNOSIS — Z1329 Encounter for screening for other suspected endocrine disorder: Secondary | ICD-10-CM

## 2022-02-22 DIAGNOSIS — G894 Chronic pain syndrome: Secondary | ICD-10-CM

## 2022-02-22 DIAGNOSIS — I10 Essential (primary) hypertension: Secondary | ICD-10-CM | POA: Diagnosis not present

## 2022-02-22 DIAGNOSIS — E785 Hyperlipidemia, unspecified: Secondary | ICD-10-CM

## 2022-02-22 DIAGNOSIS — Z1231 Encounter for screening mammogram for malignant neoplasm of breast: Secondary | ICD-10-CM

## 2022-02-22 DIAGNOSIS — G8929 Other chronic pain: Secondary | ICD-10-CM | POA: Insufficient documentation

## 2022-02-22 DIAGNOSIS — Z3042 Encounter for surveillance of injectable contraceptive: Secondary | ICD-10-CM

## 2022-02-22 DIAGNOSIS — N182 Chronic kidney disease, stage 2 (mild): Secondary | ICD-10-CM

## 2022-02-22 NOTE — Assessment & Plan Note (Signed)
She endorses a history of vitamin B12 deficiency and is currently on daily B12 supplementation. -Repeat vitamin B12 level ordered today

## 2022-02-22 NOTE — Assessment & Plan Note (Signed)
She is currently prescribed lisinopril 5 mg daily for treatment of hypertension.  Her blood pressure today was 147/87 initially and 143/92 on repeat. -No medication changes today.  Follow-up in 4 weeks for HTN check

## 2022-02-22 NOTE — Assessment & Plan Note (Signed)
PHQ-9 score is elevated today (13).  She is currently on Cymbalta.  Denies SI/HI.  She attributes her elevated score to pain and is not interested in additional treatment options at this time.

## 2022-02-22 NOTE — Progress Notes (Signed)
New Patient Office Visit  Subjective    Patient ID: Brooke [name], female    DOB: 06/22/1975  Age: 47 y.o. MRN: [id number]  CC:  Chief Complaint  Patient presents with   Establish Care    HPI [name] presents to establish care.  She is a 47 year old woman who endorses a past medical history significant for GERD, HTN, vitamin D deficiency, vitamin B12 deficiency, hyperlipidemia, fibromyalgia, peripheral neuropathy, CKD stage II, and chronic musculoskeletal pain.  She has most recently been followed by Dr. Marlon Pel at the [facility] clinic.  [name]' acute concern today is getting established in reviewing her medications.  She endorses a history of chronic pain secondary to fibromyalgia, peripheral neuropathy, bilateral carpal tunnel syndrome, cervical radiculopathy, and lumbar radiculopathy.  She was previously followed by neurology (Dr. Merlene Laughter).  His office is now closed and she has been managing her pain with Cymbalta.  Pain is poorly controlled at this time and she is interested in additional treatment options. [name] endorses current tobacco use, smoking 1 pack/day and has been smoking cigarettes off and on since age 31.  At most she has smoked 4 packs/day while living in [place].  Acute concerns, chronic medical conditions, and outstanding preventative care items discussed today are individually addressed in A/P below.  Outpatient Encounter Medications as of 02/22/2022  Medication Sig   Ascorbic Acid (VITAMIN C) 1000 MG tablet Take 1,000 mg by mouth daily.   atorvastatin (LIPITOR) 20 MG tablet Take 20 mg by mouth daily.   Cholecalciferol ([employer] D3) 10 MCG (400 UNIT) CAPS Take by mouth.   Cranberry 50 MG CHEW Chew by mouth.   Cyanocobalamin (VITAMIN B-12 PO) Take by mouth 2 (two) times daily.   DULoxetine (CYMBALTA) 60 MG capsule TAKE 1 CAPSULE BY MOUTH ONCE DAILY.   esomeprazole (NEXIUM) 40 MG capsule TAKE 1 CAPSULE DAILY AT 12 NOON.   linaclotide (LINZESS)  145 MCG CAPS capsule Take one a day as needed   lisinopril (ZESTRIL) 5 MG tablet Take 5 mg by mouth daily.   medroxyPROGESTERone (DEPO-PROVERA) 150 MG/ML injection INJECT 1ML INTRAMUSCULARLY EVERY 3 MONTHS.   mometasone (NASONEX) 50 MCG/ACT nasal spray Place 2 sprays into the nose daily.   Multiple Vitamin (MULTIVITAMIN) tablet Take 1 tablet by mouth daily.   Saccharomyces boulardii (PROBIOTIC) 250 MG CAPS Take by mouth.   SUMAtriptan (IMITREX) 100 MG tablet Take 1 tablet (100 mg total) by mouth every 2 (two) hours as needed for migraine. May repeat in 2 hours if headache persists or recurs.   [DISCONTINUED] methocarbamol (ROBAXIN) 500 MG tablet Take 1 tablet (500 mg total) by mouth 4 (four) times daily.   [DISCONTINUED] predniSONE (DELTASONE) 20 MG tablet Take by mouth.   [DISCONTINUED] Vitamin D, Ergocalciferol, (DRISDOL) 50000 units CAPS capsule Take 1 capsule (50,000 Units total) by mouth every 7 (seven) days.   No facility-administered encounter medications on file as of 02/22/2022.    Past Medical History:  Diagnosis Date   Disc degeneration, lumbar    Fibromyalgia    GERD (gastroesophageal reflux disease)    Hypertension    Insomnia    Knee pain    Migraine without aura and responsive to treatment 09/30/2016   Migraines    Nerve damage    Neuropathy    Overweight    Rectal bleeding 04/21/2021   Sinusitis, chronic    Tendonitis     Past Surgical History:  Procedure Laterality Date   MOUTH SURGERY     dental  surgery /tooth extraction    Family History  Problem Relation Age of Onset   Hypertension Mother    Fibromyalgia Mother    Neuropathy Mother    Thyroid cancer Mother    Hypertension Father    Cancer Father        ?lung   Hypertension Sister    Fibromyalgia Sister    Neuropathy Sister    Colon cancer Neg Hx    Inflammatory bowel disease Neg Hx    Celiac disease Neg Hx     Social History   Socioeconomic History   Marital status: Single    Spouse name:  Not on file   Number of children: 2   Years of education: Not on file   Highest education level: Not on file  Occupational History   Not on file  Tobacco Use   Smoking status: Every Day    Packs/day: 1.00    Years: 24.00    Total pack years: 24.00    Types: Cigarettes   Smokeless tobacco: Never  Vaping Use   Vaping Use: Some days  Substance and Sexual Activity   Alcohol use: No   Drug use: No   Sexual activity: Yes    Birth control/protection: Injection  Other Topics Concern   Not on file  Social History Narrative   Engaged to get married. Two children, age 2 and 60. They go to school. Does not work outside the house. Lives in [place]. Eats all food groups. Exercises minimally. Has another child that lives in [place] with his father.    Social Determinants of Health   Financial Resource Strain: Not on file  Food Insecurity: Not on file  Transportation Needs: Not on file  Physical Activity: Not on file  Stress: Not on file  Social Connections: Not on file  Intimate Partner Violence: Not on file   Review of Systems  Musculoskeletal:  Positive for back pain (L > R).  All other systems reviewed and are negative.  Objective    BP (!) 143/92   Pulse 98   Ht 5' 6"$  (1.676 m)   Wt 256 lb 3.2 oz (116.2 kg)   SpO2 96%   BMI 41.35 kg/m   Physical Exam Vitals reviewed.  Constitutional:      General: She is not in acute distress.    Appearance: Normal appearance. She is obese. She is not toxic-appearing.  HENT:     Head: Normocephalic and atraumatic.     Right Ear: External ear normal.     Left Ear: External ear normal.     Nose: Nose normal. No congestion or rhinorrhea.     Mouth/Throat:     Mouth: Mucous membranes are moist.     Pharynx: Oropharynx is clear. No oropharyngeal exudate or posterior oropharyngeal erythema.  Eyes:     General: No scleral icterus.    Extraocular Movements: Extraocular movements intact.     Conjunctiva/sclera: Conjunctivae normal.      Pupils: Pupils are equal, round, and reactive to light.  Cardiovascular:     Rate and Rhythm: Normal rate and regular rhythm.     Pulses: Normal pulses.     Heart sounds: Normal heart sounds. No murmur heard.    No friction rub. No gallop.  Pulmonary:     Effort: Pulmonary effort is normal.     Breath sounds: Normal breath sounds. No wheezing, rhonchi or rales.  Abdominal:     General: Abdomen is flat. Bowel sounds are normal. There is  no distension.     Palpations: Abdomen is soft.     Tenderness: There is no abdominal tenderness.  Musculoskeletal:        General: No swelling. Normal range of motion.     Cervical back: Normal range of motion.     Right lower leg: No edema.     Left lower leg: No edema.  Lymphadenopathy:     Cervical: No cervical adenopathy.  Skin:    General: Skin is warm and dry.     Capillary Refill: Capillary refill takes less than 2 seconds.     Coloration: Skin is not jaundiced.  Neurological:     General: No focal deficit present.     Mental Status: She is alert and oriented to person, place, and time.  Psychiatric:        Mood and Affect: Mood normal.        Behavior: Behavior normal.    Assessment & Plan:   Problem List Items Addressed This Visit       Essential hypertension    She is currently prescribed lisinopril 5 mg daily for treatment of hypertension.  Her blood pressure today was 147/87 initially and 143/92 on repeat. -No medication changes today.  Follow-up in 4 weeks for HTN check      Chronic GERD    Symptoms are currently well-controlled on Nexium 40 mg daily. -No medication changes today      IBS (irritable colon syndrome)    IBS-C.  She is prescribed Linzess for as needed use. -No changes today.      CKD (chronic kidney disease) stage 2, GFR 60-89 ml/min    She endorses a history of CKD stage II.  This was a recent diagnosis. -Repeat labs ordered today      Smoker    Currently smokes 1 pack/day of cigarettes and has been  smoking off and on since age 31.  She reports that she was smoking up to 4 packs/day while living in [place].  She is aware of the need to quit cigarettes but is not ready to quit at this time. -The patient was counseled on the dangers of tobacco use, and was advised to quit.  Reviewed strategies to maximize success, including removing cigarettes and smoking materials from environment, stress management, substitution of other forms of reinforcement, support of family/friends, and written materials.       Hyperlipidemia    She is currently prescribed atorvastatin 20 mg daily. -Repeat lipid panel ordered today      Surveillance for Depo-Provera contraception    Currently receives Depo-Provera injections every 3 months for contraception.  Her last injection was on 02/15/2022. -Resume Depo injections every 3 months.  Next injection due in May.      Vitamin D deficiency    She endorses a history of vitamin D deficiency and is currently on daily vitamin D supplementation. -Repeat vitamin D level ordered today      Vitamin B12 deficiency    She endorses a history of vitamin B12 deficiency and is currently on daily B12 supplementation. -Repeat vitamin B12 level ordered today      Chronic pain    She endorses a history of chronic musculoskeletal and neuropathic pain secondary to a history of fibromyalgia, peripheral neuropathy, bilateral carpal tunnel syndrome, cervical radiculopathy, and lumbar radiculopathy.  She is currently prescribed Cymbalta 60 mg daily.  Previously followed by neurology (Dr. Merlene Laughter).  She is interested in additional treatment options today. -Through shared decision-making, I have  placed a referral to pain management for her to establish care.      Positive screening for depression on 9-item Patient Health Questionnaire (PHQ-9)    PHQ-9 score is elevated today (13).  She is currently on Cymbalta.  Denies SI/HI.  She attributes her elevated score to pain and is not  interested in additional treatment options at this time.      Encounter for general adult medical examination with abnormal findings - Primary    Presenting today to establish care.  Limited records and labs were available for prior reviewed. -Repeat labs ordered today -Influenza vaccine was declined -Reports that Tdap vaccination was completed 7 years ago -Pap smear completed last year -Last mammogram completed in 2019.  Repeat mammogram ordered today -Cologuard has been ordered today for colorectal cancer screening -We will tentatively plan for follow-up in 4 weeks      Return in about 4 weeks (around 03/22/2022) for HTN, review labs.   Johnette Abraham, MD

## 2022-02-22 NOTE — Assessment & Plan Note (Signed)
Presenting today to establish care.  Limited records and labs were available for prior reviewed. -Repeat labs ordered today -Influenza vaccine was declined -Reports that Tdap vaccination was completed 7 years ago -Pap smear completed last year -Last mammogram completed in 2019.  Repeat mammogram ordered today -Cologuard has been ordered today for colorectal cancer screening -We will tentatively plan for follow-up in 4 weeks

## 2022-02-22 NOTE — Assessment & Plan Note (Signed)
Currently receives Depo-Provera injections every 3 months for contraception.  Her last injection was on 02/15/2022. -Resume Depo injections every 3 months.  Next injection due in May.

## 2022-02-22 NOTE — Assessment & Plan Note (Signed)
She endorses a history of vitamin D deficiency and is currently on daily vitamin D supplementation. -Repeat vitamin D level ordered today

## 2022-02-22 NOTE — Patient Instructions (Signed)
It was a pleasure to see you today.  Thank you for giving [place] the opportunity to be involved in your care.  Below is a brief recap of your visit and next steps.  We will plan to see you again in 4 weeks.  Summary You have established care today We will check baseline labs I have ordered cologuard and mammogram for cancer screenings Referral placed to pain management to establish care We will follow up in 4 weeks for hypertension

## 2022-02-22 NOTE — Assessment & Plan Note (Signed)
IBS-C.  She is prescribed Linzess for as needed use. -No changes today.

## 2022-02-22 NOTE — Assessment & Plan Note (Signed)
She endorses a history of CKD stage II.  This was a recent diagnosis. -Repeat labs ordered today

## 2022-02-22 NOTE — Assessment & Plan Note (Signed)
Symptoms are currently well-controlled on Nexium 40 mg daily. -No medication changes today

## 2022-02-22 NOTE — Assessment & Plan Note (Signed)
Currently smokes 1 pack/day of cigarettes and has been smoking off and on since age 47.  She reports that she was smoking up to 4 packs/day while living in [place].  She is aware of the need to quit cigarettes but is not ready to quit at this time. -The patient was counseled on the dangers of tobacco use, and was advised to quit.  Reviewed strategies to maximize success, including removing cigarettes and smoking materials from environment, stress management, substitution of other forms of reinforcement, support of family/friends, and written materials.

## 2022-02-22 NOTE — Assessment & Plan Note (Signed)
She is currently prescribed atorvastatin 20 mg daily. -Repeat lipid panel ordered today

## 2022-02-22 NOTE — Assessment & Plan Note (Signed)
She endorses a history of chronic musculoskeletal and neuropathic pain secondary to a history of fibromyalgia, peripheral neuropathy, bilateral carpal tunnel syndrome, cervical radiculopathy, and lumbar radiculopathy.  She is currently prescribed Cymbalta 60 mg daily.  Previously followed by neurology (Dr. Merlene Laughter).  She is interested in additional treatment options today. -Through shared decision-making, I have placed a referral to pain management for her to establish care.

## 2022-02-23 ENCOUNTER — Encounter: Payer: Self-pay | Admitting: Internal Medicine

## 2022-02-23 LAB — CMP14+EGFR
ALT: 21 IU/L (ref 0–32)
AST: 21 IU/L (ref 0–40)
Albumin/Globulin Ratio: 2.3 — ABNORMAL HIGH (ref 1.2–2.2)
Albumin: 4.5 g/dL (ref 3.9–4.9)
Alkaline Phosphatase: 80 IU/L (ref 44–121)
BUN/Creatinine Ratio: 8 — ABNORMAL LOW (ref 9–23)
BUN: 7 mg/dL (ref 6–24)
Bilirubin Total: 0.4 mg/dL (ref 0.0–1.2)
CO2: 18 mmol/L — ABNORMAL LOW (ref 20–29)
Calcium: 9.5 mg/dL (ref 8.7–10.2)
Chloride: 106 mmol/L (ref 96–106)
Creatinine, Ser: 0.83 mg/dL (ref 0.57–1.00)
Globulin, Total: 2 g/dL (ref 1.5–4.5)
Glucose: 82 mg/dL (ref 70–99)
Potassium: 4.2 mmol/L (ref 3.5–5.2)
Sodium: 141 mmol/L (ref 134–144)
Total Protein: 6.5 g/dL (ref 6.0–8.5)
eGFR: 89 mL/min/{1.73_m2} (ref >59)

## 2022-02-23 LAB — CBC WITH DIFFERENTIAL/PLATELET
Basophils Absolute: 0 10*3/uL (ref 0.0–0.2)
Basos: 0 %
EOS (ABSOLUTE): 0.3 10*3/uL (ref 0.0–0.4)
Eos: 3 %
Hematocrit: 45.8 % (ref 34.0–46.6)
Hemoglobin: 15.4 g/dL (ref 11.1–15.9)
Immature Grans (Abs): 0 10*3/uL (ref 0.0–0.1)
Immature Granulocytes: 0 %
Lymphocytes Absolute: 2.9 10*3/uL (ref 0.7–3.1)
Lymphs: 30 %
MCH: 33 pg (ref 26.6–33.0)
MCHC: 33.6 g/dL (ref 31.5–35.7)
MCV: 98 fL — ABNORMAL HIGH (ref 79–97)
Monocytes Absolute: 0.7 10*3/uL (ref 0.1–0.9)
Monocytes: 7 %
Neutrophils Absolute: 5.7 10*3/uL (ref 1.4–7.0)
Neutrophils: 60 %
Platelets: 307 10*3/uL (ref 150–450)
RBC: 4.66 x10E6/uL (ref 3.77–5.28)
RDW: 12.8 % (ref 11.7–15.4)
WBC: 9.7 10*3/uL (ref 3.4–10.8)

## 2022-02-23 LAB — LIPID PANEL
Chol/HDL Ratio: 4.7 ratio — ABNORMAL HIGH (ref 0.0–4.4)
Cholesterol, Total: 151 mg/dL (ref 100–199)
HDL: 32 mg/dL — ABNORMAL LOW (ref >39)
LDL Chol Calc (NIH): 81 mg/dL (ref 0–99)
Triglycerides: 227 mg/dL — ABNORMAL HIGH (ref 0–149)
VLDL Cholesterol Cal: 38 mg/dL (ref 5–40)

## 2022-02-23 LAB — B12 AND FOLATE PANEL
Folate: >20 ng/mL (ref >3.0)
Vitamin B-12: 675 pg/mL (ref 232–1245)

## 2022-02-23 LAB — VITAMIN D 25 HYDROXY (VIT D DEFICIENCY, FRACTURES): Vit D, 25-Hydroxy: 29.9 ng/mL — ABNORMAL LOW (ref 30.0–100.0)

## 2022-02-23 LAB — HEMOGLOBIN A1C
Est. average glucose Bld gHb Est-mCnc: 120 mg/dL
Hgb A1c MFr Bld: 5.8 % — ABNORMAL HIGH (ref 4.8–5.6)

## 2022-02-23 LAB — TSH+FREE T4
Free T4: 0.97 ng/dL (ref 0.82–1.77)
TSH: 2.08 u[IU]/mL (ref 0.450–4.500)

## 2022-03-01 ENCOUNTER — Other Ambulatory Visit: Payer: Self-pay | Admitting: Internal Medicine

## 2022-03-15 DIAGNOSIS — Z79899 Other long term (current) drug therapy: Secondary | ICD-10-CM | POA: Diagnosis not present

## 2022-03-15 DIAGNOSIS — M5432 Sciatica, left side: Secondary | ICD-10-CM | POA: Diagnosis not present

## 2022-03-15 DIAGNOSIS — Z79891 Long term (current) use of opiate analgesic: Secondary | ICD-10-CM | POA: Diagnosis not present

## 2022-03-15 DIAGNOSIS — M5416 Radiculopathy, lumbar region: Secondary | ICD-10-CM | POA: Diagnosis not present

## 2022-03-15 DIAGNOSIS — G894 Chronic pain syndrome: Secondary | ICD-10-CM | POA: Diagnosis not present

## 2022-03-20 DIAGNOSIS — M5432 Sciatica, left side: Secondary | ICD-10-CM | POA: Diagnosis not present

## 2022-03-20 DIAGNOSIS — Z79891 Long term (current) use of opiate analgesic: Secondary | ICD-10-CM | POA: Diagnosis not present

## 2022-03-20 DIAGNOSIS — M5416 Radiculopathy, lumbar region: Secondary | ICD-10-CM | POA: Diagnosis not present

## 2022-03-20 DIAGNOSIS — G894 Chronic pain syndrome: Secondary | ICD-10-CM | POA: Diagnosis not present

## 2022-03-22 ENCOUNTER — Ambulatory Visit: Payer: 59 | Admitting: Internal Medicine

## 2022-03-25 ENCOUNTER — Encounter: Payer: Self-pay | Admitting: Internal Medicine

## 2022-03-25 ENCOUNTER — Ambulatory Visit (INDEPENDENT_AMBULATORY_CARE_PROVIDER_SITE_OTHER): Payer: 59 | Admitting: Internal Medicine

## 2022-03-25 VITALS — BP 128/87 | HR 99 | Ht 66.0 in | Wt 249.8 lb

## 2022-03-25 DIAGNOSIS — R7303 Prediabetes: Secondary | ICD-10-CM | POA: Diagnosis not present

## 2022-03-25 DIAGNOSIS — B9689 Other specified bacterial agents as the cause of diseases classified elsewhere: Secondary | ICD-10-CM | POA: Diagnosis not present

## 2022-03-25 DIAGNOSIS — I10 Essential (primary) hypertension: Secondary | ICD-10-CM

## 2022-03-25 DIAGNOSIS — G894 Chronic pain syndrome: Secondary | ICD-10-CM

## 2022-03-25 DIAGNOSIS — J019 Acute sinusitis, unspecified: Secondary | ICD-10-CM | POA: Diagnosis not present

## 2022-03-25 DIAGNOSIS — E559 Vitamin D deficiency, unspecified: Secondary | ICD-10-CM

## 2022-03-25 MED ORDER — AMOXICILLIN 875 MG PO TABS: 875.0000 mg | ORAL_TABLET | Freq: Two times a day (BID) | ORAL | 0 refills | Status: AC

## 2022-03-25 NOTE — Progress Notes (Signed)
Established Patient Office Visit  Subjective   Patient ID: Brooke [name], female    DOB: [birth year]  Age: 47 y.o. MRN: [id number]  Chief Complaint  Patient presents with   Hypertension    Follow up   Brooke [name] returns to care today for HTN follow-up and lab review.  She was last seen by me as a new patient presenting to establish care on 2/19.  Her blood pressure was elevated at that time.  Baseline labs were ordered.  She was also referred to pain management in the setting of chronic musculoskeletal and neuropathic pain.  4-week follow-up was arranged.  There have been no acute interval events.  Brooke [name] endorses sinus congestion and pain/pressure.  Her nasal secretions are yellow.  Symptoms have not improved despite use of OTC cough/cold medications.  She has no additional concerns to discuss today.  Past Medical History:  Diagnosis Date   Disc degeneration, lumbar    Fibromyalgia    GERD (gastroesophageal reflux disease)    Hypertension    Insomnia    Knee pain    Migraine without aura and responsive to treatment 09/30/2016   Migraines    Nerve damage    Neuropathy    Overweight    Rectal bleeding 04/21/2021   Sinusitis, chronic    Tendonitis    Past Surgical History:  Procedure Laterality Date   MOUTH SURGERY     dental surgery /tooth extraction   Social History   Tobacco Use   Smoking status: Every Day    Packs/day: 1.00    Years: 24.00    Additional pack years: 0.00    Total pack years: 24.00    Types: Cigarettes   Smokeless tobacco: Never  Vaping Use   Vaping Use: Some days  Substance Use Topics   Alcohol use: No   Drug use: No   Family History  Problem Relation Age of Onset   Hypertension Mother    Fibromyalgia Mother    Neuropathy Mother    Thyroid cancer Mother    Hypertension Father    Cancer Father        ?lung   Hypertension Sister    Fibromyalgia Sister    Neuropathy Sister    Colon cancer Neg Hx    Inflammatory bowel disease  Neg Hx    Celiac disease Neg Hx    Allergies  Allergen Reactions   Ciprofloxacin Itching and Swelling   Other Other (See Comments)    Pink Eye Relief eye drops-rash on face, itchy   Sulfa Antibiotics Swelling   Zithromax [Azithromycin] Itching and Swelling    Z pack   Review of Systems  HENT:  Positive for congestion and sinus pain.   Musculoskeletal:  Positive for back pain (Chronic).  All other systems reviewed and are negative.    Objective:     BP 128/87   Pulse 99   Ht 5\' 6"  (1.676 m)   Wt 249 lb 12.8 oz (113.3 kg)   SpO2 97%   BMI 40.32 kg/m  BP Readings from Last 3 Encounters:  03/25/22 128/87  02/22/22 (!) 143/92  04/21/21 138/82   Physical Exam Vitals reviewed.  Constitutional:      General: She is not in acute distress.    Appearance: Normal appearance. She is obese. She is not toxic-appearing.  HENT:     Head: Normocephalic and atraumatic.     Right Ear: External ear normal.     Left Ear: External ear normal.  Nose: Congestion and rhinorrhea present.     Mouth/Throat:     Mouth: Mucous membranes are moist.     Pharynx: Oropharynx is clear. No oropharyngeal exudate or posterior oropharyngeal erythema.  Eyes:     General: No scleral icterus.    Extraocular Movements: Extraocular movements intact.     Conjunctiva/sclera: Conjunctivae normal.     Pupils: Pupils are equal, round, and reactive to light.  Cardiovascular:     Rate and Rhythm: Normal rate and regular rhythm.     Pulses: Normal pulses.     Heart sounds: Normal heart sounds. No murmur heard.    No friction rub. No gallop.  Pulmonary:     Effort: Pulmonary effort is normal.     Breath sounds: Normal breath sounds. No wheezing, rhonchi or rales.  Abdominal:     General: Abdomen is flat. Bowel sounds are normal. There is no distension.     Palpations: Abdomen is soft.     Tenderness: There is no abdominal tenderness.  Musculoskeletal:        General: No swelling. Normal range of motion.      Cervical back: Normal range of motion.     Right lower leg: No edema.     Left lower leg: No edema.  Lymphadenopathy:     Cervical: No cervical adenopathy.  Skin:    General: Skin is warm and dry.     Capillary Refill: Capillary refill takes less than 2 seconds.     Coloration: Skin is not jaundiced.  Neurological:     General: No focal deficit present.     Mental Status: She is alert and oriented to person, place, and time.  Psychiatric:        Mood and Affect: Mood normal.        Behavior: Behavior normal.   Last CBC Lab Results  Component Value Date   WBC 9.7 02/22/2022   HGB 15.4 02/22/2022   HCT 45.8 02/22/2022   MCV 98 (H) 02/22/2022   MCH 33.0 02/22/2022   RDW 12.8 02/22/2022   PLT 307 0000000   Last metabolic panel Lab Results  Component Value Date   GLUCOSE 82 02/22/2022   NA 141 02/22/2022   K 4.2 02/22/2022   CL 106 02/22/2022   CO2 18 (L) 02/22/2022   BUN 7 02/22/2022   CREATININE 0.83 02/22/2022   EGFR 89 02/22/2022   CALCIUM 9.5 02/22/2022   PROT 6.5 02/22/2022   ALBUMIN 4.5 02/22/2022   LABGLOB 2.0 02/22/2022   AGRATIO 2.3 (H) 02/22/2022   BILITOT 0.4 02/22/2022   ALKPHOS 80 02/22/2022   AST 21 02/22/2022   ALT 21 02/22/2022   Last lipids Lab Results  Component Value Date   CHOL 151 02/22/2022   HDL 32 (L) 02/22/2022   LDLCALC 81 02/22/2022   TRIG 227 (H) 02/22/2022   CHOLHDL 4.7 (H) 02/22/2022   Last hemoglobin A1c Lab Results  Component Value Date   HGBA1C 5.8 (H) 02/22/2022   Last thyroid functions Lab Results  Component Value Date   TSH 2.080 02/22/2022   Last vitamin D Lab Results  Component Value Date   VD25OH 29.9 (L) 02/22/2022   Last vitamin B12 and Folate Lab Results  Component Value Date   [id number] 02/22/2022   FOLATE >20.0 02/22/2022   The 10-year ASCVD risk score (Arnett DK, et al., 2019) is: 5.4%    Assessment & Plan:   Problem List Items Addressed This Visit  Essential hypertension     Presenting today for HTN follow-up.  She is currently prescribed lisinopril 5 mg daily.  BP was elevated at her last appointment.  BP today is 128/87.  States that she did not take her medication this morning prior to her appointment. -No medication changes today.  Continue lisinopril 5 mg daily      Acute bacterial sinusitis - Primary    She endorses a recent history of sinus congestion with pain/pressure.  Nasal secretions are yellow.  Symptoms have not improved with OTC cough/cold medications. -Amoxicillin 875 mg twice daily x 5 days prescribed for treatment of acute bacterial sinusitis      Vitamin D insufficiency    Noted on recent labs.  She is currently on daily vitamin D supplementation.      Chronic pain    She endorses a history of chronic musculoskeletal neuropathic pain.  She was referred to pain management at her last appointment and reports today that she has establish care and was started on oxycodone 5 mg 4 times daily as needed for pain relief.  She states that her pain has significantly improved.      Prediabetes    A1c 5.8 on labs from last month.  We reviewed the need to make significant lifestyle modifications in order to reduce your chances of progressing to diabetes. -Specifically reviewed dietary modifications aimed at limiting fatty/fried foods and incorporating more raw vegetables and lean proteins into her diet.  We also reviewed the national recommendation for at least 150 minutes of moderate intensity exercise on a weekly basis.       Return in about 6 months (around 09/25/2022).    Johnette Abraham, MD

## 2022-03-25 NOTE — Patient Instructions (Signed)
It was a pleasure to see you today.  Thank you for giving [place] the opportunity to be involved in your care.  Below is a brief recap of your visit and next steps.  We will plan to see you again in 6 months.  Summary Amoxicillin prescribed for sinus infection No additional medication changes. Follow up in 6 months

## 2022-03-28 ENCOUNTER — Encounter: Payer: Self-pay | Admitting: Internal Medicine

## 2022-03-29 ENCOUNTER — Other Ambulatory Visit: Payer: Self-pay

## 2022-03-29 DIAGNOSIS — R1013 Epigastric pain: Secondary | ICD-10-CM

## 2022-03-29 DIAGNOSIS — K219 Gastro-esophageal reflux disease without esophagitis: Secondary | ICD-10-CM

## 2022-03-29 MED ORDER — ESOMEPRAZOLE MAGNESIUM 40 MG PO CPDR: DELAYED_RELEASE_CAPSULE | ORAL | 3 refills | Status: AC

## 2022-03-31 DIAGNOSIS — B9689 Other specified bacterial agents as the cause of diseases classified elsewhere: Secondary | ICD-10-CM | POA: Insufficient documentation

## 2022-03-31 DIAGNOSIS — R7303 Prediabetes: Secondary | ICD-10-CM | POA: Insufficient documentation

## 2022-03-31 NOTE — Assessment & Plan Note (Signed)
She endorses a history of chronic musculoskeletal neuropathic pain.  She was referred to pain management at her last appointment and reports today that she has establish care and was started on oxycodone 5 mg 4 times daily as needed for pain relief.  She states that her pain has significantly improved.

## 2022-03-31 NOTE — Assessment & Plan Note (Signed)
Noted on recent labs.  She is currently on daily vitamin D supplementation.

## 2022-03-31 NOTE — Assessment & Plan Note (Signed)
A1c 5.8 on labs from last month.  We reviewed the need to make significant lifestyle modifications in order to reduce your chances of progressing to diabetes. -Specifically reviewed dietary modifications aimed at limiting fatty/fried foods and incorporating more raw vegetables and lean proteins into her diet.  We also reviewed the national recommendation for at least 150 minutes of moderate intensity exercise on a weekly basis.

## 2022-03-31 NOTE — Assessment & Plan Note (Signed)
She endorses a recent history of sinus congestion with pain/pressure.  Nasal secretions are yellow.  Symptoms have not improved with OTC cough/cold medications. -Amoxicillin 875 mg twice daily x 5 days prescribed for treatment of acute bacterial sinusitis

## 2022-03-31 NOTE — Assessment & Plan Note (Signed)
Presenting today for HTN follow-up.  She is currently prescribed lisinopril 5 mg daily.  BP was elevated at her last appointment.  BP today is 128/87.  States that she did not take her medication this morning prior to her appointment. -No medication changes today.  Continue lisinopril 5 mg daily

## 2022-04-12 DIAGNOSIS — Z79891 Long term (current) use of opiate analgesic: Secondary | ICD-10-CM | POA: Diagnosis not present

## 2022-04-12 DIAGNOSIS — M5416 Radiculopathy, lumbar region: Secondary | ICD-10-CM | POA: Diagnosis not present

## 2022-04-12 DIAGNOSIS — M5432 Sciatica, left side: Secondary | ICD-10-CM | POA: Diagnosis not present

## 2022-04-12 DIAGNOSIS — M25511 Pain in right shoulder: Secondary | ICD-10-CM | POA: Diagnosis not present

## 2022-04-12 DIAGNOSIS — M25552 Pain in left hip: Secondary | ICD-10-CM | POA: Diagnosis not present

## 2022-04-12 DIAGNOSIS — M545 Low back pain, unspecified: Secondary | ICD-10-CM | POA: Diagnosis not present

## 2022-04-12 DIAGNOSIS — M25512 Pain in left shoulder: Secondary | ICD-10-CM | POA: Diagnosis not present

## 2022-04-12 DIAGNOSIS — M5412 Radiculopathy, cervical region: Secondary | ICD-10-CM | POA: Diagnosis not present

## 2022-04-12 DIAGNOSIS — M546 Pain in thoracic spine: Secondary | ICD-10-CM | POA: Diagnosis not present

## 2022-04-12 DIAGNOSIS — G894 Chronic pain syndrome: Secondary | ICD-10-CM | POA: Diagnosis not present

## 2022-04-20 DIAGNOSIS — G894 Chronic pain syndrome: Secondary | ICD-10-CM | POA: Diagnosis not present

## 2022-04-20 DIAGNOSIS — Z79891 Long term (current) use of opiate analgesic: Secondary | ICD-10-CM | POA: Diagnosis not present

## 2022-04-20 DIAGNOSIS — M546 Pain in thoracic spine: Secondary | ICD-10-CM | POA: Diagnosis not present

## 2022-04-20 DIAGNOSIS — M5416 Radiculopathy, lumbar region: Secondary | ICD-10-CM | POA: Diagnosis not present

## 2022-04-30 DIAGNOSIS — G894 Chronic pain syndrome: Secondary | ICD-10-CM | POA: Diagnosis not present

## 2022-04-30 DIAGNOSIS — M545 Low back pain, unspecified: Secondary | ICD-10-CM | POA: Diagnosis not present

## 2022-04-30 DIAGNOSIS — M5432 Sciatica, left side: Secondary | ICD-10-CM | POA: Diagnosis not present

## 2022-04-30 DIAGNOSIS — M25552 Pain in left hip: Secondary | ICD-10-CM | POA: Diagnosis not present

## 2022-04-30 DIAGNOSIS — M546 Pain in thoracic spine: Secondary | ICD-10-CM | POA: Diagnosis not present

## 2022-04-30 DIAGNOSIS — M25512 Pain in left shoulder: Secondary | ICD-10-CM | POA: Diagnosis not present

## 2022-04-30 DIAGNOSIS — M5412 Radiculopathy, cervical region: Secondary | ICD-10-CM | POA: Diagnosis not present

## 2022-04-30 DIAGNOSIS — M542 Cervicalgia: Secondary | ICD-10-CM | POA: Diagnosis not present

## 2022-04-30 DIAGNOSIS — M5416 Radiculopathy, lumbar region: Secondary | ICD-10-CM | POA: Diagnosis not present

## 2022-04-30 DIAGNOSIS — M25511 Pain in right shoulder: Secondary | ICD-10-CM | POA: Diagnosis not present

## 2022-05-07 ENCOUNTER — Ambulatory Visit: Payer: 59

## 2022-05-10 DIAGNOSIS — M5416 Radiculopathy, lumbar region: Secondary | ICD-10-CM | POA: Diagnosis not present

## 2022-05-10 DIAGNOSIS — Z79891 Long term (current) use of opiate analgesic: Secondary | ICD-10-CM | POA: Diagnosis not present

## 2022-05-10 DIAGNOSIS — M25512 Pain in left shoulder: Secondary | ICD-10-CM | POA: Diagnosis not present

## 2022-05-10 DIAGNOSIS — Z79899 Other long term (current) drug therapy: Secondary | ICD-10-CM | POA: Diagnosis not present

## 2022-05-10 DIAGNOSIS — G894 Chronic pain syndrome: Secondary | ICD-10-CM | POA: Diagnosis not present

## 2022-05-10 DIAGNOSIS — M545 Low back pain, unspecified: Secondary | ICD-10-CM | POA: Diagnosis not present

## 2022-05-10 DIAGNOSIS — M5432 Sciatica, left side: Secondary | ICD-10-CM | POA: Diagnosis not present

## 2022-05-10 DIAGNOSIS — M25511 Pain in right shoulder: Secondary | ICD-10-CM | POA: Diagnosis not present

## 2022-05-10 DIAGNOSIS — M546 Pain in thoracic spine: Secondary | ICD-10-CM | POA: Diagnosis not present

## 2022-05-10 DIAGNOSIS — M5412 Radiculopathy, cervical region: Secondary | ICD-10-CM | POA: Diagnosis not present

## 2022-05-10 DIAGNOSIS — M25552 Pain in left hip: Secondary | ICD-10-CM | POA: Diagnosis not present

## 2022-05-11 ENCOUNTER — Ambulatory Visit (INDEPENDENT_AMBULATORY_CARE_PROVIDER_SITE_OTHER): Payer: 59

## 2022-05-11 DIAGNOSIS — Z789 Other specified health status: Secondary | ICD-10-CM

## 2022-05-11 MED ORDER — MEDROXYPROGESTERONE ACETATE 150 MG/ML IM SUSP
150.0000 mg | Freq: Once | INTRAMUSCULAR | Status: AC
  Administered 2022-05-11: 150 mg via INTRAMUSCULAR

## 2022-05-17 ENCOUNTER — Encounter: Payer: Self-pay | Admitting: Internal Medicine

## 2022-05-19 ENCOUNTER — Encounter: Payer: Self-pay | Admitting: Internal Medicine

## 2022-05-20 ENCOUNTER — Other Ambulatory Visit: Payer: Self-pay

## 2022-05-20 DIAGNOSIS — M546 Pain in thoracic spine: Secondary | ICD-10-CM | POA: Diagnosis not present

## 2022-05-20 DIAGNOSIS — G894 Chronic pain syndrome: Secondary | ICD-10-CM | POA: Diagnosis not present

## 2022-05-20 DIAGNOSIS — M5416 Radiculopathy, lumbar region: Secondary | ICD-10-CM | POA: Diagnosis not present

## 2022-05-20 DIAGNOSIS — Z79891 Long term (current) use of opiate analgesic: Secondary | ICD-10-CM | POA: Diagnosis not present

## 2022-05-20 DIAGNOSIS — G8929 Other chronic pain: Secondary | ICD-10-CM

## 2022-05-26 ENCOUNTER — Encounter: Payer: Self-pay | Admitting: Internal Medicine

## 2022-05-27 DIAGNOSIS — R350 Frequency of micturition: Secondary | ICD-10-CM | POA: Diagnosis not present

## 2022-05-27 DIAGNOSIS — R109 Unspecified abdominal pain: Secondary | ICD-10-CM | POA: Diagnosis not present

## 2022-05-27 DIAGNOSIS — Z5321 Procedure and treatment not carried out due to patient leaving prior to being seen by health care provider: Secondary | ICD-10-CM | POA: Diagnosis not present

## 2022-05-27 DIAGNOSIS — F17210 Nicotine dependence, cigarettes, uncomplicated: Secondary | ICD-10-CM | POA: Diagnosis not present

## 2022-05-27 DIAGNOSIS — M797 Fibromyalgia: Secondary | ICD-10-CM | POA: Diagnosis not present

## 2022-05-27 DIAGNOSIS — R1031 Right lower quadrant pain: Secondary | ICD-10-CM | POA: Diagnosis not present

## 2022-05-27 DIAGNOSIS — Z882 Allergy status to sulfonamides status: Secondary | ICD-10-CM | POA: Diagnosis not present

## 2022-05-27 DIAGNOSIS — I10 Essential (primary) hypertension: Secondary | ICD-10-CM | POA: Diagnosis not present

## 2022-05-27 DIAGNOSIS — M5414 Radiculopathy, thoracic region: Secondary | ICD-10-CM | POA: Diagnosis not present

## 2022-05-28 ENCOUNTER — Encounter: Payer: Self-pay | Admitting: Internal Medicine

## 2022-06-03 ENCOUNTER — Ambulatory Visit: Payer: 59 | Admitting: Orthopaedic Surgery

## 2022-06-07 DIAGNOSIS — M5412 Radiculopathy, cervical region: Secondary | ICD-10-CM | POA: Diagnosis not present

## 2022-06-07 DIAGNOSIS — M546 Pain in thoracic spine: Secondary | ICD-10-CM | POA: Diagnosis not present

## 2022-06-07 DIAGNOSIS — M545 Low back pain, unspecified: Secondary | ICD-10-CM | POA: Diagnosis not present

## 2022-06-07 DIAGNOSIS — M5416 Radiculopathy, lumbar region: Secondary | ICD-10-CM | POA: Diagnosis not present

## 2022-06-07 DIAGNOSIS — M25552 Pain in left hip: Secondary | ICD-10-CM | POA: Diagnosis not present

## 2022-06-07 DIAGNOSIS — G894 Chronic pain syndrome: Secondary | ICD-10-CM | POA: Diagnosis not present

## 2022-06-07 DIAGNOSIS — M25511 Pain in right shoulder: Secondary | ICD-10-CM | POA: Diagnosis not present

## 2022-06-07 DIAGNOSIS — M5432 Sciatica, left side: Secondary | ICD-10-CM | POA: Diagnosis not present

## 2022-06-07 DIAGNOSIS — M25512 Pain in left shoulder: Secondary | ICD-10-CM | POA: Diagnosis not present

## 2022-06-07 DIAGNOSIS — Z79891 Long term (current) use of opiate analgesic: Secondary | ICD-10-CM | POA: Diagnosis not present

## 2022-06-08 ENCOUNTER — Ambulatory Visit (INDEPENDENT_AMBULATORY_CARE_PROVIDER_SITE_OTHER): Payer: 59 | Admitting: Internal Medicine

## 2022-06-08 ENCOUNTER — Encounter: Payer: Self-pay | Admitting: Internal Medicine

## 2022-06-08 ENCOUNTER — Ambulatory Visit (HOSPITAL_COMMUNITY)
Admission: RE | Admit: 2022-06-08 | Discharge: 2022-06-08 | Disposition: A | Discharge status: Home / Self Care | Payer: 59 | Source: Ambulatory Visit | Attending: Internal Medicine | Admitting: Internal Medicine

## 2022-06-08 VITALS — BP 137/86 | HR 97 | Ht 66.0 in | Wt 237.0 lb

## 2022-06-08 DIAGNOSIS — K8020 Calculus of gallbladder without cholecystitis without obstruction: Secondary | ICD-10-CM | POA: Diagnosis not present

## 2022-06-08 DIAGNOSIS — R1084 Generalized abdominal pain: Secondary | ICD-10-CM

## 2022-06-08 DIAGNOSIS — N200 Calculus of kidney: Secondary | ICD-10-CM | POA: Diagnosis not present

## 2022-06-08 LAB — POCT URINALYSIS DIP (CLINITEK)
Bilirubin, UA: NEGATIVE
Blood, UA: NEGATIVE
Glucose, UA: NEGATIVE mg/dL
Ketones, POC UA: NEGATIVE mg/dL
Nitrite, UA: NEGATIVE
POC PROTEIN,UA: NEGATIVE
Spec Grav, UA: 1.02 (ref 1.010–1.025)
Urobilinogen, UA: 1 E.U./dL
pH, UA: 7 (ref 5.0–8.0)

## 2022-06-08 NOTE — Assessment & Plan Note (Addendum)
Presenting today for an acute visit endorsing a nearly 2-week history of generalized abdominal pain.  Pain began 5/23 along the right side of her abdomen but is now bilateral in nature.  Recently treated with Keflex for suspected UTI.  She denies urinary symptoms currently.  Pain is triggered by eating and sleeping on her right or left side.  She additionally reports yellow/orange-colored stools. -Etiology of her symptoms is unclear.  Differential includes pancreatitis, biliary colic, enteritis, or persistent UTI though UA today is not consistent with infection. -CBC, CMP, and lipase level ordered today -CT abdomen pelvis ordered for further investigation -Further workup pending initial lab results

## 2022-06-08 NOTE — Patient Instructions (Signed)
It was a pleasure to see you today.  Thank you for giving [place] the opportunity to be involved in your care.  Below is a brief recap of your visit and next steps.  We will plan to see you again in September.  Summary Check labs and urine study today CT abdomen ordered Follow up pending results

## 2022-06-08 NOTE — Progress Notes (Signed)
Acute Office Visit  Subjective:     Patient ID: Brooke [name], female    DOB: June 17, 1975, 47 y.o.   MRN: [id number]  Chief Complaint  Patient presents with   Pain    Right side pain that started on 05/27/2022.   Brooke [name] presents today for an acute visit endorsing generalized abdominal pain.  She reports onset of right-sided abdominal pain close to 2 weeks ago.  She presented to urgent care on 5/23 endorsing similar symptoms.  UA was potentially concerning for infection.  She was treated with Keflex x 7 days.  Brooke [name] reports that this dulled her pain, however pain gradually returned and is now bilateral.  She states that her stools have been lighter in color recently.  Pain is triggered by eating and laying on her right or left side.  She denies fever/chills, nausea/vomiting, and dysuria.  States that she is afraid to eat as it will exacerbate her symptoms.  Review of Systems  Constitutional:  Negative for chills, fever, malaise/fatigue and weight loss.  Gastrointestinal:  Positive for abdominal pain (Generalized). Negative for blood in stool, constipation, diarrhea, heartburn, melena, nausea and vomiting.       Orange/yellow-colored stools  Genitourinary:  Positive for flank pain. Negative for dysuria, frequency, hematuria and urgency.      Objective:    BP 137/86   Pulse 97   Ht 5\' 6"  (1.676 m)   Wt 237 lb (107.5 kg)   SpO2 98%   BMI 38.25 kg/m   Physical Exam Vitals reviewed.  Constitutional:      General: She is not in acute distress.    Appearance: Normal appearance. She is obese. She is not ill-appearing or toxic-appearing.  HENT:     Head: Normocephalic and atraumatic.     Right Ear: External ear normal.     Left Ear: External ear normal.     Nose: Nose normal. No congestion or rhinorrhea.     Mouth/Throat:     Mouth: Mucous membranes are moist.     Pharynx: Oropharynx is clear. No oropharyngeal exudate or posterior oropharyngeal erythema.  Eyes:      General: No scleral icterus.    Extraocular Movements: Extraocular movements intact.     Conjunctiva/sclera: Conjunctivae normal.     Pupils: Pupils are equal, round, and reactive to light.  Cardiovascular:     Rate and Rhythm: Normal rate and regular rhythm.     Pulses: Normal pulses.     Heart sounds: Normal heart sounds. No murmur heard.    No friction rub. No gallop.  Pulmonary:     Effort: Pulmonary effort is normal.     Breath sounds: Normal breath sounds. No wheezing, rhonchi or rales.  Abdominal:     General: Bowel sounds are normal. There is no distension.     Palpations: Abdomen is soft. There is no mass.     Tenderness: There is abdominal tenderness (Mild TTP diffusely). There is no right CVA tenderness, left CVA tenderness or rebound.     Hernia: No hernia is present.  Musculoskeletal:        General: No swelling. Normal range of motion.     Cervical back: Normal range of motion.     Right lower leg: No edema.     Left lower leg: No edema.  Lymphadenopathy:     Cervical: No cervical adenopathy.  Skin:    General: Skin is warm and dry.     Capillary Refill: Capillary refill takes  less than 2 seconds.     Coloration: Skin is not jaundiced.  Neurological:     General: No focal deficit present.     Mental Status: She is alert and oriented to person, place, and time.  Psychiatric:        Mood and Affect: Mood normal.        Behavior: Behavior normal.       Assessment & Plan:   Problem List Items Addressed This Visit       Generalized abdominal pain - Primary    Presenting today for an acute visit endorsing a nearly 2-week history of generalized abdominal pain.  Pain began 5/23 along the right side of her abdomen but is now bilateral in nature.  Recently treated with Keflex for suspected UTI.  She denies urinary symptoms currently.  Pain is triggered by eating and sleeping on her right or left side.  She additionally reports yellow/orange-colored stools. -Etiology  of her symptoms is unclear.  Differential includes pancreatitis, biliary colic, enteritis, or persistent UTI though UA today is not consistent with infection. -CBC, CMP, and lipase level ordered today -CT abdomen pelvis ordered for further investigation -Further workup pending initial lab results      Return if symptoms worsen or fail to improve.  Billie Lade, MD

## 2022-06-09 ENCOUNTER — Encounter: Payer: Self-pay | Admitting: Internal Medicine

## 2022-06-09 ENCOUNTER — Other Ambulatory Visit: Payer: Self-pay | Admitting: Internal Medicine

## 2022-06-09 DIAGNOSIS — K8020 Calculus of gallbladder without cholecystitis without obstruction: Secondary | ICD-10-CM

## 2022-06-09 LAB — CBC WITH DIFFERENTIAL/PLATELET
Basophils Absolute: 0 10*3/uL (ref 0.0–0.2)
Basos: 0 %
EOS (ABSOLUTE): 0.2 10*3/uL (ref 0.0–0.4)
Eos: 2 %
Hematocrit: 47.5 % — ABNORMAL HIGH (ref 34.0–46.6)
Hemoglobin: 16 g/dL — ABNORMAL HIGH (ref 11.1–15.9)
Immature Grans (Abs): 0.1 10*3/uL (ref 0.0–0.1)
Immature Granulocytes: 1 %
Lymphocytes Absolute: 2.5 10*3/uL (ref 0.7–3.1)
Lymphs: 29 %
MCH: 32.1 pg (ref 26.6–33.0)
MCHC: 33.7 g/dL (ref 31.5–35.7)
MCV: 95 fL (ref 79–97)
Monocytes Absolute: 0.6 10*3/uL (ref 0.1–0.9)
Monocytes: 7 %
Neutrophils Absolute: 5.3 10*3/uL (ref 1.4–7.0)
Neutrophils: 61 %
Platelets: 278 10*3/uL (ref 150–450)
RBC: 4.98 x10E6/uL (ref 3.77–5.28)
RDW: 12.2 % (ref 11.7–15.4)
WBC: 8.6 10*3/uL (ref 3.4–10.8)

## 2022-06-09 LAB — CMP14+EGFR
ALT: 17 IU/L (ref 0–32)
AST: 17 IU/L (ref 0–40)
Albumin/Globulin Ratio: 1.9 (ref 1.2–2.2)
Albumin: 4.4 g/dL (ref 3.9–4.9)
Alkaline Phosphatase: 97 IU/L (ref 44–121)
BUN/Creatinine Ratio: 8 — ABNORMAL LOW (ref 9–23)
BUN: 7 mg/dL (ref 6–24)
Bilirubin Total: 0.4 mg/dL (ref 0.0–1.2)
CO2: 22 mmol/L (ref 20–29)
Calcium: 9.8 mg/dL (ref 8.7–10.2)
Chloride: 105 mmol/L (ref 96–106)
Creatinine, Ser: 0.89 mg/dL (ref 0.57–1.00)
Globulin, Total: 2.3 g/dL (ref 1.5–4.5)
Glucose: 88 mg/dL (ref 70–99)
Potassium: 4.2 mmol/L (ref 3.5–5.2)
Sodium: 140 mmol/L (ref 134–144)
Total Protein: 6.7 g/dL (ref 6.0–8.5)
eGFR: 81 mL/min/{1.73_m2} (ref >59)

## 2022-06-09 LAB — LIPASE: Lipase: 88 U/L — ABNORMAL HIGH (ref 14–72)

## 2022-06-09 NOTE — Telephone Encounter (Signed)
Patient returning a miss call.

## 2022-06-10 ENCOUNTER — Encounter: Payer: Self-pay | Admitting: Orthopaedic Surgery

## 2022-06-10 ENCOUNTER — Encounter: Payer: Self-pay | Admitting: Internal Medicine

## 2022-06-10 ENCOUNTER — Ambulatory Visit (INDEPENDENT_AMBULATORY_CARE_PROVIDER_SITE_OTHER): Payer: 59 | Admitting: Orthopaedic Surgery

## 2022-06-10 VITALS — Ht 67.0 in | Wt 237.0 lb

## 2022-06-10 DIAGNOSIS — G5601 Carpal tunnel syndrome, right upper limb: Secondary | ICD-10-CM | POA: Diagnosis not present

## 2022-06-10 NOTE — Progress Notes (Signed)
Office Visit Note   Patient: Brooke [name]           Date of Birth: April 15, 1975           MRN: [id number] Visit Date: 06/10/2022              Requested by: Billie Lade, MD [address] [place],  [place] [address] PCP: Billie Lade, MD   Assessment & Plan: Visit Diagnoses:  1. Carpal tunnel syndrome, right upper limb     Plan: Patient had moderate carpal tunnel now for at least 3 years because electrical studies failed conservative treatment including different splints.  She like proceed with right carpal tunnel release.  Left hand is not as symptomatic.  Procedure discussed questions elicited and answered she understands question proceed.  Follow-Up Instructions: No follow-ups on file.   Orders:  No orders of the defined types were placed in this encounter.  No orders of the defined types were placed in this encounter.     Procedures: No procedures performed   Clinical Data: No additional findings.   Subjective: Chief Complaint  Patient presents with   Right Hand - Numbness, Pain   Left Hand - Pain, Numbness    HPI 47 year old female seen by Dr. Delmar Landau with bilateral hand numbness worse in the right dominant hand and left.  Wakes her up at night she has tried different splints she now has a total of 4 different braces.  Electrical test showed moderate carpal tunnel which were done in 2021.  Her symptoms are recently gotten worse in the last 9 months and she is ready to proceed with operative intervention.  Patient not working she states she is disabled.  Lumbar MRI scan shows some mild disc bulging.  She has some stage II kidney disease GERD.  Review of Systems all systems noncontributory HPI.  Positive for hypertension BMI 37.   Objective: Vital Signs: Ht 5\' 7"  (1.702 m)   Wt 237 lb (107.5 kg)   BMI 37.12 kg/m   Physical Exam Constitutional:      Appearance: She is well-developed.  HENT:     Head: Normocephalic.     Right Ear:  External ear normal.     Left Ear: External ear normal. There is no impacted cerumen.  Eyes:     Pupils: Pupils are equal, round, and reactive to light.  Neck:     Thyroid: No thyromegaly.     Trachea: No tracheal deviation.  Cardiovascular:     Rate and Rhythm: Normal rate.  Pulmonary:     Effort: Pulmonary effort is normal.  Abdominal:     Palpations: Abdomen is soft.  Musculoskeletal:     Cervical back: No rigidity.  Skin:    General: Skin is warm and dry.  Neurological:     Mental Status: She is alert and oriented to person, place, and time.  Psychiatric:        Behavior: Behavior normal.     Ortho Exam right positive Phalen's greater than left positive carpal compression test right no thenar weakness or atrophy right or left.  Interossei abnormal good cervical range of motion.  Specialty Comments:  No specialty comments available.  Imaging: No results found.   PMFS History: Patient Active Problem List   Diagnosis Date Noted   Carpal tunnel syndrome, right upper limb 06/10/2022   Prediabetes 03/31/2022   Acute bacterial sinusitis 03/31/2022   Essential hypertension 02/22/2022   Vitamin D insufficiency 02/22/2022  Vitamin B12 deficiency 02/22/2022   Chronic pain 02/22/2022   CKD (chronic kidney disease) stage 2, GFR 60-89 ml/min 02/22/2022   IBS (irritable colon syndrome) 02/22/2022   Positive screening for depression on 9-item Patient Health Questionnaire (PHQ-9) 02/22/2022   Encounter for general adult medical examination with abnormal findings 02/22/2022   Generalized abdominal pain 04/21/2021   Bloating 04/21/2021   Chronic GERD 04/21/2021   Screening for colorectal cancer 06/14/2017   Surveillance for Depo-Provera contraception 06/14/2017   Hyperlipidemia 02/25/2017   Borderline abnormal thyroid function test 02/25/2017   Leg cramp 02/25/2017   Migraines 09/30/2016   Tired 05/06/2016   Well woman exam with routine gynecological exam 05/06/2016    Obesity 05/05/2015   Smoker 05/05/2015   Elevated BP 05/05/2015   Neck pain 03/06/2015   Past Medical History:  Diagnosis Date   Disc degeneration, lumbar    Fibromyalgia    GERD (gastroesophageal reflux disease)    Hypertension    Insomnia    Knee pain    Migraine without aura and responsive to treatment 09/30/2016   Migraines    Nerve damage    Neuropathy    Overweight    Rectal bleeding 04/21/2021   Sinusitis, chronic    Tendonitis     Family History  Problem Relation Age of Onset   Hypertension Mother    Fibromyalgia Mother    Neuropathy Mother    Thyroid cancer Mother    Hypertension Father    Cancer Father        ?lung   Hypertension Sister    Fibromyalgia Sister    Neuropathy Sister    Colon cancer Neg Hx    Inflammatory bowel disease Neg Hx    Celiac disease Neg Hx     Past Surgical History:  Procedure Laterality Date   MOUTH SURGERY     dental surgery /tooth extraction   Social History   Occupational History   Not on file  Tobacco Use   Smoking status: Every Day    Packs/day: 1.00    Years: 24.00    Additional pack years: 0.00    Total pack years: 24.00    Types: Cigarettes   Smokeless tobacco: Never  Vaping Use   Vaping Use: Some days  Substance and Sexual Activity   Alcohol use: No   Drug use: No   Sexual activity: Yes    Birth control/protection: Injection

## 2022-06-10 NOTE — Progress Notes (Deleted)
Office Visit Note   Patient: Brooke [name]           Date of Birth: 02/27/1975           MRN: [id number] Visit Date: 06/10/2022              Requested by: Billie Lade, MD [address] [place],  [place] [address] PCP: Billie Lade, MD   Assessment & Plan: Visit Diagnoses: No diagnosis found.  Plan: ***  Follow-Up Instructions: No follow-ups on file.   Orders:  No orders of the defined types were placed in this encounter.  No orders of the defined types were placed in this encounter.     Procedures: No procedures performed   Clinical Data: No additional findings.   Subjective: Chief Complaint  Patient presents with   Right Hand - Numbness, Pain   Left Hand - Pain, Numbness    HPI  Review of Systems   Objective: Vital Signs: Ht 5\' 7"  (1.702 m)   Wt 237 lb (107.5 kg)   BMI 37.12 kg/m   Physical Exam  Ortho Exam  Specialty Comments:  No specialty comments available.  Imaging: No results found.   PMFS History: Patient Active Problem List   Diagnosis Date Noted   Prediabetes 03/31/2022   Acute bacterial sinusitis 03/31/2022   Essential hypertension 02/22/2022   Vitamin D insufficiency 02/22/2022   Vitamin B12 deficiency 02/22/2022   Chronic pain 02/22/2022   CKD (chronic kidney disease) stage 2, GFR 60-89 ml/min 02/22/2022   IBS (irritable colon syndrome) 02/22/2022   Positive screening for depression on 9-item Patient Health Questionnaire (PHQ-9) 02/22/2022   Encounter for general adult medical examination with abnormal findings 02/22/2022   Generalized abdominal pain 04/21/2021   Bloating 04/21/2021   Chronic GERD 04/21/2021   Screening for colorectal cancer 06/14/2017   Surveillance for Depo-Provera contraception 06/14/2017   Hyperlipidemia 02/25/2017   Borderline abnormal thyroid function test 02/25/2017   Leg cramp 02/25/2017   Migraines 09/30/2016   Tired 05/06/2016   Well woman exam with routine gynecological  exam 05/06/2016   Obesity 05/05/2015   Smoker 05/05/2015   Elevated BP 05/05/2015   Neck pain 03/06/2015   Past Medical History:  Diagnosis Date   Disc degeneration, lumbar    Fibromyalgia    GERD (gastroesophageal reflux disease)    Hypertension    Insomnia    Knee pain    Migraine without aura and responsive to treatment 09/30/2016   Migraines    Nerve damage    Neuropathy    Overweight    Rectal bleeding 04/21/2021   Sinusitis, chronic    Tendonitis     Family History  Problem Relation Age of Onset   Hypertension Mother    Fibromyalgia Mother    Neuropathy Mother    Thyroid cancer Mother    Hypertension Father    Cancer Father        ?lung   Hypertension Sister    Fibromyalgia Sister    Neuropathy Sister    Colon cancer Neg Hx    Inflammatory bowel disease Neg Hx    Celiac disease Neg Hx     Past Surgical History:  Procedure Laterality Date   MOUTH SURGERY     dental surgery /tooth extraction   Social History   Occupational History   Not on file  Tobacco Use   Smoking status: Every Day    Packs/day: 1.00    Years: 24.00  Additional pack years: 0.00    Total pack years: 24.00    Types: Cigarettes   Smokeless tobacco: Never  Vaping Use   Vaping Use: Some days  Substance and Sexual Activity   Alcohol use: No   Drug use: No   Sexual activity: Yes    Birth control/protection: Injection

## 2022-06-11 ENCOUNTER — Other Ambulatory Visit: Payer: Self-pay | Admitting: Internal Medicine

## 2022-06-11 DIAGNOSIS — N3000 Acute cystitis without hematuria: Secondary | ICD-10-CM

## 2022-06-11 MED ORDER — CEPHALEXIN 500 MG PO CAPS: 500.0000 mg | ORAL_CAPSULE | Freq: Two times a day (BID) | ORAL | 0 refills | Status: AC

## 2022-06-14 ENCOUNTER — Encounter: Payer: Self-pay | Admitting: Internal Medicine

## 2022-06-14 ENCOUNTER — Ambulatory Visit (HOSPITAL_COMMUNITY)
Admission: RE | Admit: 2022-06-14 | Discharge: 2022-06-14 | Disposition: A | Discharge status: Home / Self Care | Payer: 59 | Source: Ambulatory Visit | Attending: Internal Medicine | Admitting: Internal Medicine

## 2022-06-14 DIAGNOSIS — K8020 Calculus of gallbladder without cholecystitis without obstruction: Secondary | ICD-10-CM | POA: Diagnosis not present

## 2022-06-14 DIAGNOSIS — K824 Cholesterolosis of gallbladder: Secondary | ICD-10-CM | POA: Diagnosis not present

## 2022-06-18 ENCOUNTER — Encounter: Payer: Self-pay | Admitting: Internal Medicine

## 2022-06-18 MED ORDER — CEPHALEXIN 500 MG PO CAPS: 500.0000 mg | ORAL_CAPSULE | Freq: Two times a day (BID) | ORAL | 0 refills | Status: AC

## 2022-06-20 DIAGNOSIS — G894 Chronic pain syndrome: Secondary | ICD-10-CM | POA: Diagnosis not present

## 2022-06-20 DIAGNOSIS — M5416 Radiculopathy, lumbar region: Secondary | ICD-10-CM | POA: Diagnosis not present

## 2022-06-20 DIAGNOSIS — Z79891 Long term (current) use of opiate analgesic: Secondary | ICD-10-CM | POA: Diagnosis not present

## 2022-06-20 DIAGNOSIS — M546 Pain in thoracic spine: Secondary | ICD-10-CM | POA: Diagnosis not present

## 2022-06-21 ENCOUNTER — Encounter: Payer: Self-pay | Admitting: Internal Medicine

## 2022-06-22 ENCOUNTER — Other Ambulatory Visit: Payer: Self-pay | Admitting: Internal Medicine

## 2022-06-22 ENCOUNTER — Other Ambulatory Visit: Payer: Self-pay

## 2022-06-22 DIAGNOSIS — R252 Cramp and spasm: Secondary | ICD-10-CM

## 2022-06-24 ENCOUNTER — Ambulatory Visit (INDEPENDENT_AMBULATORY_CARE_PROVIDER_SITE_OTHER): Payer: 59 | Admitting: General Surgery

## 2022-06-24 ENCOUNTER — Encounter: Payer: Self-pay | Admitting: General Surgery

## 2022-06-24 VITALS — BP 125/84 | HR 99 | Temp 98.0°F | Resp 14 | Ht 67.0 in | Wt 235.0 lb

## 2022-06-24 DIAGNOSIS — R252 Cramp and spasm: Secondary | ICD-10-CM | POA: Diagnosis not present

## 2022-06-24 DIAGNOSIS — K8020 Calculus of gallbladder without cholecystitis without obstruction: Secondary | ICD-10-CM

## 2022-06-24 NOTE — Progress Notes (Signed)
Brooke [name]; [id number]; 07-Jan-1975   HPI Patient is a 47 year old white female who was referred to my care by Dr. Nita Sells for evaluation and treatment of biliary colic secondary to cholelithiasis.  Over the past 1 to 2 months, patient has had increasing episodes of right upper quadrant abdominal pain with radiation to the right flank, nausea, vomiting, and bloating.  This is occurring daily with multiple foods.  She denies any fever, chills, or jaundice.  Ultrasound the gallbladder was performed which revealed cholelithiasis with a normal common bile duct. Past Medical History:  Diagnosis Date   Disc degeneration, lumbar    Fibromyalgia    GERD (gastroesophageal reflux disease)    Hypertension    Insomnia    Knee pain    Migraine without aura and responsive to treatment 09/30/2016   Migraines    Nerve damage    Neuropathy    Overweight    Rectal bleeding 04/21/2021   Sinusitis, chronic    Tendonitis     Past Surgical History:  Procedure Laterality Date   MOUTH SURGERY     dental surgery /tooth extraction    Family History  Problem Relation Age of Onset   Hypertension Mother    Fibromyalgia Mother    Neuropathy Mother    Thyroid cancer Mother    Hypertension Father    Cancer Father        ?lung   Hypertension Sister    Fibromyalgia Sister    Neuropathy Sister    Colon cancer Neg Hx    Inflammatory bowel disease Neg Hx    Celiac disease Neg Hx     Current Outpatient Medications on File Prior to Visit  Medication Sig Dispense Refill   Ascorbic Acid (VITAMIN C) 1000 MG tablet Take 1,000 mg by mouth daily.     atorvastatin (LIPITOR) 20 MG tablet TAKE 1 TABLET DAILY FOR HIGH CHOLESTEROL. 30 tablet 0   Cholecalciferol ([employer] D3) 10 MCG (400 UNIT) CAPS Take by mouth.     Cranberry 50 MG CHEW Chew by mouth.     Cyanocobalamin (VITAMIN B-12 PO) Take by mouth 2 (two) times daily.     DULoxetine (CYMBALTA) 60 MG capsule TAKE 1 CAPSULE BY MOUTH ONCE DAILY. 30 capsule 3    esomeprazole (NEXIUM) 40 MG capsule TAKE 1 CAPSULE DAILY AT 12 NOON. 90 capsule 3   linaclotide (LINZESS) 145 MCG CAPS capsule Take one a day as needed 30 capsule 0   lisinopril (ZESTRIL) 5 MG tablet TAKE 1 TABLET EVERY DAY BLOOD PRESSURE 30 tablet 0   medroxyPROGESTERone (DEPO-PROVERA) 150 MG/ML injection INJECT INTRAMUSCULARLY EVERY 3 MONTHS. 1 mL 0   Menaquinone-7 (K2 PO) Take 100 mg by mouth daily.     mometasone (NASONEX) 50 MCG/ACT nasal spray Place 2 sprays into the nose daily. 17 g 12   Multiple Vitamin (MULTIVITAMIN) tablet Take 1 tablet by mouth daily.     oxyCODONE (OXY IR/ROXICODONE) 5 MG immediate release tablet Take 5 mg by mouth 4 (four) times daily as needed.     Saccharomyces boulardii (PROBIOTIC) 250 MG CAPS Take by mouth.     SUMAtriptan (IMITREX) 100 MG tablet Take 1 tablet (100 mg total) by mouth every 2 (two) hours as needed for migraine. May repeat in 2 hours if headache persists or recurs. 9 tablet 3   ZTLIDO 1.8 % PTCH Apply topically.     No current facility-administered medications on file prior to visit.    Allergies  Allergen Reactions  Ciprofloxacin Itching and Swelling   Other Other (See Comments)    Pink Eye Relief eye drops-rash on face, itchy   Sulfa Antibiotics Swelling   Zithromax [Azithromycin] Itching and Swelling    Z pack    Social History   Substance and Sexual Activity  Alcohol Use No    Social History   Tobacco Use  Smoking Status Every Day   Packs/day: 1.00   Years: 24.00   Additional pack years: 0.00   Total pack years: 24.00   Types: Cigarettes  Smokeless Tobacco Never    Review of Systems  Constitutional:  Positive for malaise/fatigue.  HENT:  Positive for sinus pain.   Eyes: Negative.   Respiratory: Negative.    Cardiovascular: Negative.   Gastrointestinal:  Positive for abdominal pain, heartburn, nausea and vomiting.  Genitourinary:  Positive for dysuria and frequency.  Musculoskeletal:  Positive for back pain,  joint pain and neck pain.  Skin: Negative.   Neurological: Negative.   Endo/Heme/Allergies: Negative.   Psychiatric/Behavioral: Negative.      Objective   Vitals:   06/24/22 0935  BP: 125/84  Pulse: 99  Resp: 14  Temp: 98 F (36.7 C)  SpO2: 98%    Physical Exam Vitals reviewed.  Constitutional:      Appearance: Normal appearance. She is obese. She is not ill-appearing.  HENT:     Head: Normocephalic and atraumatic.  Eyes:     General: No scleral icterus. Cardiovascular:     Rate and Rhythm: Normal rate and regular rhythm.     Heart sounds: Normal heart sounds. No murmur heard.    No friction rub. No gallop.  Pulmonary:     Effort: Pulmonary effort is normal. No respiratory distress.     Breath sounds: Normal breath sounds. No stridor. No wheezing, rhonchi or rales.  Abdominal:     General: Bowel sounds are normal. There is no distension.     Palpations: Abdomen is soft. There is no mass.     Tenderness: There is no abdominal tenderness. There is no guarding or rebound.     Hernia: No hernia is present.     Comments: Discomft to deep palpation in right upper quadrant.  No rigidity noted.  Skin:    General: Skin is warm and dry.  Neurological:     Mental Status: She is alert and oriented to person, place, and time.    Ultrasound reviewed Recent laboratory test reviewed Assessment  Biliary colic secondary to cholelithiasis Plan  Patient is scheduled for robotic assisted laparoscopic cholecystectomy on 07/01/2022.  The risks and benefits of the procedure including bleeding, infection, hepatobiliary injury, and the possibility of an open procedure were fully explained to the patient, who gave informed consent.

## 2022-06-24 NOTE — H&P (Signed)
Brooke [name]; [id number]; 08/17/1975   HPI Patient is a 47-year-old white female who was referred to my care by Dr. John Hall for evaluation and treatment of biliary colic secondary to cholelithiasis.  Over the past 1 to 2 months, patient has had increasing episodes of right upper quadrant abdominal pain with radiation to the right flank, nausea, vomiting, and bloating.  This is occurring daily with multiple foods.  She denies any fever, chills, or jaundice.  Ultrasound the gallbladder was performed which revealed cholelithiasis with a normal common bile duct. Past Medical History:  Diagnosis Date   Disc degeneration, lumbar    Fibromyalgia    GERD (gastroesophageal reflux disease)    Hypertension    Insomnia    Knee pain    Migraine without aura and responsive to treatment 09/30/2016   Migraines    Nerve damage    Neuropathy    Overweight    Rectal bleeding 04/21/2021   Sinusitis, chronic    Tendonitis     Past Surgical History:  Procedure Laterality Date   MOUTH SURGERY     dental surgery /tooth extraction    Family History  Problem Relation Age of Onset   Hypertension Mother    Fibromyalgia Mother    Neuropathy Mother    Thyroid cancer Mother    Hypertension Father    Cancer Father        ?lung   Hypertension Sister    Fibromyalgia Sister    Neuropathy Sister    Colon cancer Neg Hx    Inflammatory bowel disease Neg Hx    Celiac disease Neg Hx     Current Outpatient Medications on File Prior to Visit  Medication Sig Dispense Refill   Ascorbic Acid (VITAMIN C) 1000 MG tablet Take 1,000 mg by mouth daily.     atorvastatin (LIPITOR) 20 MG tablet TAKE 1 TABLET DAILY FOR HIGH CHOLESTEROL. 30 tablet 0   Cholecalciferol (CVS D3) 10 MCG (400 UNIT) CAPS Take by mouth.     Cranberry 50 MG CHEW Chew by mouth.     Cyanocobalamin (VITAMIN B-12 PO) Take by mouth 2 (two) times daily.     DULoxetine (CYMBALTA) 60 MG capsule TAKE 1 CAPSULE BY MOUTH ONCE DAILY. 30 capsule 3    esomeprazole (NEXIUM) 40 MG capsule TAKE 1 CAPSULE DAILY AT 12 NOON. 90 capsule 3   linaclotide (LINZESS) 145 MCG CAPS capsule Take one a day as needed 30 capsule 0   lisinopril (ZESTRIL) 5 MG tablet TAKE 1 TABLET EVERY DAY BLOOD PRESSURE 30 tablet 0   medroxyPROGESTERone (DEPO-PROVERA) 150 MG/ML injection INJECT 1ML INTRAMUSCULARLY EVERY 3 MONTHS. 1 mL 0   Menaquinone-7 (K2 PO) Take 100 mg by mouth daily.     mometasone (NASONEX) 50 MCG/ACT nasal spray Place 2 sprays into the nose daily. 17 g 12   Multiple Vitamin (MULTIVITAMIN) tablet Take 1 tablet by mouth daily.     oxyCODONE (OXY IR/ROXICODONE) 5 MG immediate release tablet Take 5 mg by mouth 4 (four) times daily as needed.     Saccharomyces boulardii (PROBIOTIC) 250 MG CAPS Take by mouth.     SUMAtriptan (IMITREX) 100 MG tablet Take 1 tablet (100 mg total) by mouth every 2 (two) hours as needed for migraine. May repeat in 2 hours if headache persists or recurs. 9 tablet 3   ZTLIDO 1.8 % PTCH Apply topically.     No current facility-administered medications on file prior to visit.    Allergies  Allergen Reactions     Ciprofloxacin Itching and Swelling   Other Other (See Comments)    Pink Eye Relief eye drops-rash on face, itchy   Sulfa Antibiotics Swelling   Zithromax [Azithromycin] Itching and Swelling    Z pack    Social History   Substance and Sexual Activity  Alcohol Use No    Social History   Tobacco Use  Smoking Status Every Day   Packs/day: 1.00   Years: 24.00   Additional pack years: 0.00   Total pack years: 24.00   Types: Cigarettes  Smokeless Tobacco Never    Review of Systems  Constitutional:  Positive for malaise/fatigue.  HENT:  Positive for sinus pain.   Eyes: Negative.   Respiratory: Negative.    Cardiovascular: Negative.   Gastrointestinal:  Positive for abdominal pain, heartburn, nausea and vomiting.  Genitourinary:  Positive for dysuria and frequency.  Musculoskeletal:  Positive for back pain,  joint pain and neck pain.  Skin: Negative.   Neurological: Negative.   Endo/Heme/Allergies: Negative.   Psychiatric/Behavioral: Negative.      Objective   Vitals:   06/24/22 0935  BP: 125/84  Pulse: 99  Resp: 14  Temp: 98 F (36.7 C)  SpO2: 98%    Physical Exam Vitals reviewed.  Constitutional:      Appearance: Normal appearance. She is obese. She is not ill-appearing.  HENT:     Head: Normocephalic and atraumatic.  Eyes:     General: No scleral icterus. Cardiovascular:     Rate and Rhythm: Normal rate and regular rhythm.     Heart sounds: Normal heart sounds. No murmur heard.    No friction rub. No gallop.  Pulmonary:     Effort: Pulmonary effort is normal. No respiratory distress.     Breath sounds: Normal breath sounds. No stridor. No wheezing, rhonchi or rales.  Abdominal:     General: Bowel sounds are normal. There is no distension.     Palpations: Abdomen is soft. There is no mass.     Tenderness: There is no abdominal tenderness. There is no guarding or rebound.     Hernia: No hernia is present.     Comments: Discomft to deep palpation in right upper quadrant.  No rigidity noted.  Skin:    General: Skin is warm and dry.  Neurological:     Mental Status: She is alert and oriented to person, place, and time.    Ultrasound reviewed Recent laboratory test reviewed Assessment  Biliary colic secondary to cholelithiasis Plan  Patient is scheduled for robotic assisted laparoscopic cholecystectomy on 07/01/2022.  The risks and benefits of the procedure including bleeding, infection, hepatobiliary injury, and the possibility of an open procedure were fully explained to the patient, who gave informed consent. 

## 2022-06-25 ENCOUNTER — Encounter: Payer: Self-pay | Admitting: General Surgery

## 2022-06-25 LAB — MAGNESIUM: Magnesium: 2.2 mg/dL (ref 1.6–2.3)

## 2022-06-29 ENCOUNTER — Encounter (HOSPITAL_COMMUNITY): Payer: Self-pay

## 2022-06-29 ENCOUNTER — Encounter (HOSPITAL_COMMUNITY)
Admission: RE | Admit: 2022-06-29 | Discharge: 2022-06-29 | Disposition: A | Discharge status: Home / Self Care | Payer: 59 | Source: Ambulatory Visit | Attending: General Surgery | Admitting: General Surgery

## 2022-06-29 DIAGNOSIS — Z01818 Encounter for other preprocedural examination: Secondary | ICD-10-CM

## 2022-06-29 DIAGNOSIS — I10 Essential (primary) hypertension: Secondary | ICD-10-CM

## 2022-07-01 ENCOUNTER — Encounter (HOSPITAL_COMMUNITY)
Admission: RE | Disposition: A | Discharge status: Home / Self Care | Payer: Self-pay | Source: Home / Self Care | Attending: General Surgery

## 2022-07-01 ENCOUNTER — Other Ambulatory Visit: Payer: Self-pay

## 2022-07-01 ENCOUNTER — Ambulatory Visit (HOSPITAL_COMMUNITY): Payer: 59 | Admitting: Certified Registered"

## 2022-07-01 ENCOUNTER — Encounter (HOSPITAL_COMMUNITY): Payer: Self-pay | Admitting: General Surgery

## 2022-07-01 ENCOUNTER — Ambulatory Visit (HOSPITAL_BASED_OUTPATIENT_CLINIC_OR_DEPARTMENT_OTHER): Payer: 59 | Admitting: Certified Registered"

## 2022-07-01 ENCOUNTER — Ambulatory Visit (HOSPITAL_COMMUNITY)
Admission: RE | Admit: 2022-07-01 | Discharge: 2022-07-01 | Disposition: A | Discharge status: Home / Self Care | Payer: 59 | Attending: General Surgery | Admitting: General Surgery

## 2022-07-01 DIAGNOSIS — K8020 Calculus of gallbladder without cholecystitis without obstruction: Secondary | ICD-10-CM | POA: Diagnosis not present

## 2022-07-01 DIAGNOSIS — K8010 Calculus of gallbladder with chronic cholecystitis without obstruction: Secondary | ICD-10-CM | POA: Insufficient documentation

## 2022-07-01 DIAGNOSIS — I10 Essential (primary) hypertension: Secondary | ICD-10-CM | POA: Diagnosis not present

## 2022-07-01 DIAGNOSIS — G43909 Migraine, unspecified, not intractable, without status migrainosus: Secondary | ICD-10-CM | POA: Insufficient documentation

## 2022-07-01 DIAGNOSIS — Z6836 Body mass index (BMI) 36.0-36.9, adult: Secondary | ICD-10-CM | POA: Insufficient documentation

## 2022-07-01 DIAGNOSIS — Z01818 Encounter for other preprocedural examination: Secondary | ICD-10-CM

## 2022-07-01 DIAGNOSIS — K219 Gastro-esophageal reflux disease without esophagitis: Secondary | ICD-10-CM | POA: Insufficient documentation

## 2022-07-01 DIAGNOSIS — F17200 Nicotine dependence, unspecified, uncomplicated: Secondary | ICD-10-CM | POA: Diagnosis not present

## 2022-07-01 DIAGNOSIS — F17210 Nicotine dependence, cigarettes, uncomplicated: Secondary | ICD-10-CM

## 2022-07-01 DIAGNOSIS — K8050 Calculus of bile duct without cholangitis or cholecystitis without obstruction: Secondary | ICD-10-CM | POA: Diagnosis present

## 2022-07-01 HISTORY — PX: GALLBLADDER SURGERY: SHX652

## 2022-07-01 LAB — POCT PREGNANCY, URINE: Preg Test, Ur: NEGATIVE

## 2022-07-01 SURGERY — CHOLECYSTECTOMY, ROBOT-ASSISTED, LAPAROSCOPIC
Anesthesia: General | Site: Abdomen

## 2022-07-01 MED ORDER — INDOCYANINE GREEN 25 MG IV SOLR
2.5000 mg | Freq: Once | INTRAVENOUS | Status: AC
  Administered 2022-07-01: 2.5 mg via INTRAVENOUS

## 2022-07-01 MED ORDER — LIDOCAINE HCL (CARDIAC) PF 50 MG/5ML IV SOSY
PREFILLED_SYRINGE | INTRAVENOUS | Status: DC | PRN
  Administered 2022-07-01: 100 mg via INTRAVENOUS

## 2022-07-01 MED ORDER — FENTANYL CITRATE PF 50 MCG/ML IJ SOSY
25.0000 ug | PREFILLED_SYRINGE | INTRAMUSCULAR | Status: DC | PRN
  Administered 2022-07-01: 50 ug via INTRAVENOUS
  Filled 2022-07-01: qty 1

## 2022-07-01 MED ORDER — CEFAZOLIN SODIUM-DEXTROSE 2-4 GM/100ML-% IV SOLN
INTRAVENOUS | Status: AC
  Filled 2022-07-01: qty 100

## 2022-07-01 MED ORDER — SCOPOLAMINE 1 MG/3DAYS TD PT72: 1.0000 | MEDICATED_PATCH | Freq: Once | TRANSDERMAL | Status: DC

## 2022-07-01 MED ORDER — ONDANSETRON HCL 4 MG/2ML IJ SOLN: 4.0000 mg | Freq: Once | INTRAMUSCULAR | Status: DC | PRN

## 2022-07-01 MED ORDER — ONDANSETRON HCL 4 MG/2ML IJ SOLN
INTRAMUSCULAR | Status: DC | PRN
  Administered 2022-07-01: 4 mg via INTRAVENOUS

## 2022-07-01 MED ORDER — OXYCODONE HCL 5 MG PO TABS: 5.0000 mg | ORAL_TABLET | ORAL | 0 refills | Status: DC | PRN

## 2022-07-01 MED ORDER — HYDROMORPHONE HCL 1 MG/ML IJ SOLN
INTRAMUSCULAR | Status: AC
  Filled 2022-07-01: qty 1

## 2022-07-01 MED ORDER — LABETALOL HCL 5 MG/ML IV SOLN
INTRAVENOUS | Status: DC | PRN
  Administered 2022-07-01: 5 mg via INTRAVENOUS

## 2022-07-01 MED ORDER — ROCURONIUM 10MG/ML (10ML) SYRINGE FOR MEDFUSION PUMP - OPTIME
INTRAVENOUS | Status: DC | PRN
  Administered 2022-07-01: 70 mg via INTRAVENOUS

## 2022-07-01 MED ORDER — CHLORHEXIDINE GLUCONATE CLOTH 2 % EX PADS: 6.0000 | MEDICATED_PAD | Freq: Once | CUTANEOUS | Status: DC

## 2022-07-01 MED ORDER — ONDANSETRON HCL 4 MG PO TABS: 4.0000 mg | ORAL_TABLET | Freq: Three times a day (TID) | ORAL | 0 refills | Status: AC | PRN

## 2022-07-01 MED ORDER — HYDROMORPHONE HCL 1 MG/ML IJ SOLN
INTRAMUSCULAR | Status: DC | PRN
  Administered 2022-07-01 (×2): .5 mg via INTRAVENOUS

## 2022-07-01 MED ORDER — FENTANYL CITRATE (PF) 100 MCG/2ML IJ SOLN
INTRAMUSCULAR | Status: DC | PRN
  Administered 2022-07-01: 50 ug via INTRAVENOUS
  Administered 2022-07-01: 100 ug via INTRAVENOUS
  Administered 2022-07-01: 50 ug via INTRAVENOUS

## 2022-07-01 MED ORDER — SUGAMMADEX SODIUM 200 MG/2ML IV SOLN
INTRAVENOUS | Status: DC | PRN
  Administered 2022-07-01: 200 mg via INTRAVENOUS

## 2022-07-01 MED ORDER — PROPOFOL 10 MG/ML IV BOLUS
INTRAVENOUS | Status: AC
  Filled 2022-07-01: qty 20

## 2022-07-01 MED ORDER — SEVOFLURANE IN SOLN
RESPIRATORY_TRACT | Status: AC
  Filled 2022-07-01: qty 250

## 2022-07-01 MED ORDER — KETOROLAC TROMETHAMINE 15 MG/ML IJ SOLN
INTRAMUSCULAR | Status: DC | PRN
  Administered 2022-07-01: 15 mg via INTRAVENOUS

## 2022-07-01 MED ORDER — STERILE WATER FOR IRRIGATION IR SOLN
Status: DC | PRN
  Administered 2022-07-01: 1000 mL

## 2022-07-01 MED ORDER — LACTATED RINGERS IV SOLN: INTRAVENOUS | Status: DC

## 2022-07-01 MED ORDER — LACTATED RINGERS IV SOLN: INTRAVENOUS | Status: DC | PRN

## 2022-07-01 MED ORDER — OXYCODONE HCL 5 MG PO TABS
ORAL_TABLET | ORAL | Status: AC
  Filled 2022-07-01: qty 1

## 2022-07-01 MED ORDER — BUPIVACAINE HCL (PF) 0.5 % IJ SOLN
INTRAMUSCULAR | Status: DC | PRN
  Administered 2022-07-01: 30 mL

## 2022-07-01 MED ORDER — CHLORHEXIDINE GLUCONATE 0.12 % MT SOLN: 15.0000 mL | Freq: Once | OROMUCOSAL | Status: DC

## 2022-07-01 MED ORDER — FENTANYL CITRATE (PF) 100 MCG/2ML IJ SOLN
INTRAMUSCULAR | Status: AC
  Filled 2022-07-01: qty 2

## 2022-07-01 MED ORDER — CEFAZOLIN SODIUM-DEXTROSE 2-4 GM/100ML-% IV SOLN
2.0000 g | INTRAVENOUS | Status: AC
  Administered 2022-07-01: 2 g via INTRAVENOUS

## 2022-07-01 MED ORDER — MIDAZOLAM HCL 5 MG/5ML IJ SOLN
INTRAMUSCULAR | Status: DC | PRN
  Administered 2022-07-01: 2 mg via INTRAVENOUS

## 2022-07-01 MED ORDER — INDOCYANINE GREEN 25 MG IV SOLR
INTRAVENOUS | Status: AC
  Filled 2022-07-01: qty 10

## 2022-07-01 MED ORDER — DEXAMETHASONE SODIUM PHOSPHATE 10 MG/ML IJ SOLN
INTRAMUSCULAR | Status: AC
  Filled 2022-07-01: qty 1

## 2022-07-01 MED ORDER — LIDOCAINE HCL (PF) 2 % IJ SOLN
INTRAMUSCULAR | Status: AC
  Filled 2022-07-01: qty 5

## 2022-07-01 MED ORDER — ONDANSETRON HCL 4 MG/2ML IJ SOLN
INTRAMUSCULAR | Status: AC
  Filled 2022-07-01: qty 2

## 2022-07-01 MED ORDER — OXYCODONE HCL 5 MG PO TABS
5.0000 mg | ORAL_TABLET | Freq: Once | ORAL | Status: AC | PRN
  Administered 2022-07-01: 5 mg via ORAL

## 2022-07-01 MED ORDER — OXYCODONE HCL 5 MG/5ML PO SOLN: 5.0000 mg | Freq: Once | ORAL | Status: AC | PRN

## 2022-07-01 MED ORDER — DEXMEDETOMIDINE HCL IN NACL 200 MCG/50ML IV SOLN
INTRAVENOUS | Status: DC | PRN
  Administered 2022-07-01: 10 ug via INTRAVENOUS

## 2022-07-01 MED ORDER — ROCURONIUM BROMIDE 10 MG/ML (PF) SYRINGE
PREFILLED_SYRINGE | INTRAVENOUS | Status: AC
  Filled 2022-07-01: qty 10

## 2022-07-01 MED ORDER — KETOROLAC TROMETHAMINE 30 MG/ML IJ SOLN
INTRAMUSCULAR | Status: AC
  Filled 2022-07-01: qty 2

## 2022-07-01 MED ORDER — ORAL CARE MOUTH RINSE: 15.0000 mL | Freq: Once | OROMUCOSAL | Status: DC

## 2022-07-01 MED ORDER — MIDAZOLAM HCL 2 MG/2ML IJ SOLN
INTRAMUSCULAR | Status: AC
  Filled 2022-07-01: qty 2

## 2022-07-01 MED ORDER — DEXAMETHASONE SODIUM PHOSPHATE 10 MG/ML IJ SOLN
INTRAMUSCULAR | Status: DC | PRN
  Administered 2022-07-01: 10 mg via INTRAVENOUS

## 2022-07-01 MED ORDER — BUPIVACAINE HCL (PF) 0.5 % IJ SOLN
INTRAMUSCULAR | Status: AC
  Filled 2022-07-01: qty 30

## 2022-07-01 MED ORDER — PROPOFOL 10 MG/ML IV BOLUS
INTRAVENOUS | Status: DC | PRN
  Administered 2022-07-01: 200 mg via INTRAVENOUS

## 2022-07-01 SURGICAL SUPPLY — 41 items
ADH SKN CLS APL DERMABOND .7 (GAUZE/BANDAGES/DRESSINGS) ×1
APL PRP STRL LF DISP 70% ISPRP (MISCELLANEOUS) ×1
CAUTERY HOOK MNPLR 1.6 DVNC XI (INSTRUMENTS) ×1 IMPLANT
CHLORAPREP W/TINT 26 (MISCELLANEOUS) ×1 IMPLANT
CLIP LIGATING HEM O LOK PURPLE (MISCELLANEOUS) ×1 IMPLANT
COVER TIP SHEARS 8 DVNC (MISCELLANEOUS) IMPLANT
DEFOGGER SCOPE WARMER CLEARIFY (MISCELLANEOUS) IMPLANT
DERMABOND ADVANCED .7 DNX12 (GAUZE/BANDAGES/DRESSINGS) ×1 IMPLANT
DRAPE ARM DVNC X/XI (DISPOSABLE) ×4 IMPLANT
DRAPE COLUMN DVNC XI (DISPOSABLE) ×1 IMPLANT
ELECT REM PT RETURN 9FT ADLT (ELECTROSURGICAL) ×1
ELECTRODE REM PT RTRN 9FT ADLT (ELECTROSURGICAL) ×1 IMPLANT
FORCEPS BPLR R/ABLATION 8 DVNC (INSTRUMENTS) ×1 IMPLANT
FORCEPS PROGRASP DVNC XI (FORCEP) ×1 IMPLANT
GLOVE BIOGEL PI IND STRL 7.0 (GLOVE) ×2 IMPLANT
GLOVE SURG SS PI 7.5 STRL IVOR (GLOVE) ×2 IMPLANT
GOWN STRL REUS W/TWL LRG LVL3 (GOWN DISPOSABLE) ×3 IMPLANT
GRASPER SUT TROCAR 14GX15 (MISCELLANEOUS) IMPLANT
IRRIGATOR SUCT 8 DISP DVNC XI (IRRIGATION / IRRIGATOR) IMPLANT
IV NS IRRIG 3000ML ARTHROMATIC (IV SOLUTION) IMPLANT
KIT TURNOVER KIT A (KITS) ×1 IMPLANT
MANIFOLD NEPTUNE II (INSTRUMENTS) ×1 IMPLANT
NDL HYPO 21X1.5 SAFETY (NEEDLE) ×1 IMPLANT
NDL INSUFFLATION 14GA 120MM (NEEDLE) ×1 IMPLANT
NEEDLE HYPO 21X1.5 SAFETY (NEEDLE) ×1 IMPLANT
NEEDLE INSUFFLATION 14GA 120MM (NEEDLE) ×1 IMPLANT
OBTURATOR OPTICAL STND 8 DVNC (TROCAR) ×1
OBTURATOR OPTICALSTD 8 DVNC (TROCAR) ×1 IMPLANT
PACK LAP CHOLE LZT030E (CUSTOM PROCEDURE TRAY) ×1 IMPLANT
PAD ARMBOARD 7.5X6 YLW CONV (MISCELLANEOUS) ×1 IMPLANT
PENCIL HANDSWITCHING (ELECTRODE) ×1 IMPLANT
SCISSORS MNPLR CVD DVNC XI (INSTRUMENTS) IMPLANT
SEAL CANN UNIV 5-8 DVNC XI (MISCELLANEOUS) ×4 IMPLANT
SET TUBE SMOKE EVAC HIGH FLOW (TUBING) ×1 IMPLANT
SPIKE FLUID TRANSFER (MISCELLANEOUS) ×1 IMPLANT
SUT MNCRL AB 4-0 PS2 18 (SUTURE) ×2 IMPLANT
SUT VICRYL 0 AB UR-6 (SUTURE) ×1 IMPLANT
SYR 30ML LL (SYRINGE) ×1 IMPLANT
SYS RETRIEVAL 5MM INZII UNIV (BASKET) ×1
SYSTEM RETRIEVL 5MM INZII UNIV (BASKET) ×1 IMPLANT
WATER STERILE IRR 500ML POUR (IV SOLUTION) ×1 IMPLANT

## 2022-07-01 NOTE — Anesthesia Procedure Notes (Signed)
Procedure Name: Intubation Date/Time: 07/01/2022 2:23 PM  Performed by: Moshe Salisbury, CRNAPre-anesthesia Checklist: Patient identified, Patient being monitored, Timeout performed, Emergency Drugs available and Suction available Patient Re-evaluated:Patient Re-evaluated prior to induction Oxygen Delivery Method: Circle system utilized Preoxygenation: Pre-oxygenation with 100% oxygen Induction Type: IV induction Ventilation: Mask ventilation without difficulty Laryngoscope Size: Mac and 3 Grade View: Grade I Tube type: Oral Tube size: 7.0 mm Number of attempts: 1 Airway Equipment and Method: Stylet Placement Confirmation: ETT inserted through vocal cords under direct vision, positive ETCO2 and breath sounds checked- equal and bilateral Secured at: 22 cm Tube secured with: Tape Dental Injury: Teeth and Oropharynx as per pre-operative assessment

## 2022-07-01 NOTE — Interval H&P Note (Signed)
History and Physical Interval Note:  07/01/2022 1:29 PM  Brooke [name]  has presented today for surgery, with the diagnosis of Cholelithiasis.  The various methods of treatment have been discussed with the patient and family. After consideration of risks, benefits and other options for treatment, the patient has consented to  Procedure(s): XI ROBOTIC ASSISTED LAPAROSCOPIC CHOLECYSTECTOMY (N/A) as a surgical intervention.  The patient's history has been reviewed, patient examined, no change in status, stable for surgery.  I have reviewed the patient's chart and labs.  Questions were answered to the patient's satisfaction.     Franky Macho

## 2022-07-01 NOTE — Op Note (Signed)
Patient:  Brooke [name]  DOB:  27-Aug-1975  MRN:  [id number]   Preop Diagnosis: Biliary colic, cholelithiasis  Postop Diagnosis: Same  Procedure: Robotic assisted laparoscopic cholecystectomy  Surgeon: Franky Macho, MD  Anes: General endotracheal  Indications: Patient is a 47 year old white female who presents with biliary colic secondary to cholelithiasis.  The risks and benefits of the procedure including bleeding, infection, hepatobiliary injury, the possibility of an open procedure were fully explained to the patient, who gave informed consent.  Procedure note: The patient was placed in the supine position.  After induction of general endotracheal anesthesia, the abdomen was prepped and draped using usual sterile technique with ChloraPrep.  Surgical site confirmation was performed.  A supraumbilical incision was made down to the fascia.  A Veress needle was introduced into the abdominal cavity and confirmation of placement was done using the saline drop test.  The abdomen was then insufflated to 15 mmHg pressure.  An 8 mm trocar was introduced into the abdominal cavity under direct visualization without difficulty.  The patient was placed in reverse Trendelenburg position and additional 8 mm trocars were placed on the left upper quadrant, right mid abdomen, and right flank regions.  The robot was then targeted and docked.  The liver appeared to be within normal limits.  The gallbladder was retracted in a dynamic fashion in order to provide a critical view of the triangle of Calot.  The cystic duct was first identified.  Its junction to the infundibulum was fully identified.  This was confirmed by firefly.  Hem-o-lok clips were placed proximally distally on the cystic duct and cystic duct was divided.  This was likewise done and cystic artery.  The gallbladder was freed away from the gallbladder fossa using Bovie electrocautery.  The gallbladder was delivered through the left upper quadrant  trocar site without difficulty.  The gallbladder fossa was inspected and no abnormal bleeding or bile leakage was noted.  The robot was then undocked.  All air was then evacuated from the abdominal cavity prior to removal of the trocars.  All wounds were irrigated with normal saline.  All wounds were injected with point 5% Sensorcaine.  All incisions were closed using a 4-0 Monocryl subcuticular suture.  Dermabond was applied.  All tape and needle counts were correct at the end of the procedure.  The patient was extubated in the operating room and transferred to PACU in stable condition.  Complications: None  EBL: Minimal  Specimen: Gallbladder

## 2022-07-01 NOTE — Transfer of Care (Signed)
Immediate Anesthesia Transfer of Care Note  Patient: Brooke [name]  Procedure(s) Performed: XI ROBOTIC ASSISTED LAPAROSCOPIC CHOLECYSTECTOMY (Abdomen)  Patient Location: PACU  Anesthesia Type:General  Level of Consciousness: awake and alert   Airway & Oxygen Therapy: Patient Spontanous Breathing  Post-op Assessment: Report given to RN  Post vital signs: Reviewed and stable  Last Vitals:  Vitals Value Taken Time  BP 118/75 07/01/22 1530  Temp 36.3 C 07/01/22 1530  Pulse 73 07/01/22 1534  Resp 20 07/01/22 1534  SpO2 100 % 07/01/22 1534  Vitals shown include unvalidated device data.  Last Pain:  Vitals:   07/01/22 1301  TempSrc: Oral  PainSc: 4       Patients Stated Pain Goal: 2 (07/01/22 1301)  Complications: No notable events documented.

## 2022-07-01 NOTE — Anesthesia Preprocedure Evaluation (Signed)
Anesthesia Evaluation  Patient identified by MRN, date of birth, ID band Patient awake    Reviewed: Allergy & Precautions, H&P , NPO status , Patient's Chart, lab work & pertinent test results, reviewed documented beta blocker date and time   Airway Mallampati: II  TM Distance: >3 FB Neck ROM: full    Dental no notable dental hx.    Pulmonary neg pulmonary ROS, Current Smoker   Pulmonary exam normal breath sounds clear to auscultation       Cardiovascular Exercise Tolerance: Good hypertension, negative cardio ROS  Rhythm:regular Rate:Normal     Neuro/Psych  Headaches  Neuromuscular disease negative neurological ROS  negative psych ROS   GI/Hepatic negative GI ROS, Neg liver ROS,GERD  ,,  Endo/Other    Morbid obesity  Renal/GU Renal diseasenegative Renal ROS  negative genitourinary   Musculoskeletal   Abdominal   Peds  Hematology negative hematology ROS (+)   Anesthesia Other Findings   Reproductive/Obstetrics negative OB ROS                             Anesthesia Physical Anesthesia Plan  ASA: 3  Anesthesia Plan: General and General ETT   Post-op Pain Management:    Induction:   PONV Risk Score and Plan: Ondansetron and Scopolamine patch - Pre-op  Airway Management Planned:   Additional Equipment:   Intra-op Plan:   Post-operative Plan:   Informed Consent: I have reviewed the patients History and Physical, chart, labs and discussed the procedure including the risks, benefits and alternatives for the proposed anesthesia with the patient or authorized representative who has indicated his/her understanding and acceptance.     Dental Advisory Given  Plan Discussed with: CRNA  Anesthesia Plan Comments:        Anesthesia Quick Evaluation

## 2022-07-02 ENCOUNTER — Encounter: Payer: Self-pay | Admitting: General Surgery

## 2022-07-04 ENCOUNTER — Encounter: Payer: Self-pay | Admitting: General Surgery

## 2022-07-05 ENCOUNTER — Encounter: Payer: Self-pay | Admitting: General Surgery

## 2022-07-05 ENCOUNTER — Emergency Department (HOSPITAL_COMMUNITY): Payer: 59

## 2022-07-05 ENCOUNTER — Emergency Department (HOSPITAL_COMMUNITY)
Admission: EM | Admit: 2022-07-05 | Discharge: 2022-07-05 | Disposition: A | Discharge status: Home / Self Care | Payer: 59 | Attending: Emergency Medicine | Admitting: Emergency Medicine

## 2022-07-05 ENCOUNTER — Other Ambulatory Visit: Payer: Self-pay

## 2022-07-05 ENCOUNTER — Encounter (HOSPITAL_COMMUNITY): Payer: Self-pay

## 2022-07-05 DIAGNOSIS — N202 Calculus of kidney with calculus of ureter: Secondary | ICD-10-CM | POA: Diagnosis not present

## 2022-07-05 DIAGNOSIS — N201 Calculus of ureter: Secondary | ICD-10-CM | POA: Insufficient documentation

## 2022-07-05 DIAGNOSIS — R109 Unspecified abdominal pain: Secondary | ICD-10-CM | POA: Diagnosis not present

## 2022-07-05 LAB — CBC WITH DIFFERENTIAL/PLATELET
Abs Immature Granulocytes: 0.07 10*3/uL (ref 0.00–0.07)
Basophils Absolute: 0 10*3/uL (ref 0.0–0.1)
Basophils Relative: 0 %
Eosinophils Absolute: 0.3 10*3/uL (ref 0.0–0.5)
Eosinophils Relative: 2 %
HCT: 43.6 % (ref 36.0–46.0)
Hemoglobin: 15.3 g/dL — ABNORMAL HIGH (ref 12.0–15.0)
Immature Granulocytes: 1 %
Lymphocytes Relative: 22 %
Lymphs Abs: 3 10*3/uL (ref 0.7–4.0)
MCH: 32.5 pg (ref 26.0–34.0)
MCHC: 35.1 g/dL (ref 30.0–36.0)
MCV: 92.6 fL (ref 80.0–100.0)
Monocytes Absolute: 0.8 10*3/uL (ref 0.1–1.0)
Monocytes Relative: 6 %
Neutro Abs: 9.5 10*3/uL — ABNORMAL HIGH (ref 1.7–7.7)
Neutrophils Relative %: 69 %
Platelets: 268 10*3/uL (ref 150–400)
RBC: 4.71 MIL/uL (ref 3.87–5.11)
RDW: 11.9 % (ref 11.5–15.5)
WBC: 13.7 10*3/uL — ABNORMAL HIGH (ref 4.0–10.5)
nRBC: 0 % (ref 0.0–0.2)

## 2022-07-05 LAB — COMPREHENSIVE METABOLIC PANEL
ALT: 20 U/L (ref 0–44)
AST: 15 U/L (ref 15–41)
Albumin: 4 g/dL (ref 3.5–5.0)
Alkaline Phosphatase: 78 U/L (ref 38–126)
Anion gap: 12 (ref 5–15)
BUN: 10 mg/dL (ref 6–20)
CO2: 23 mmol/L (ref 22–32)
Calcium: 9.4 mg/dL (ref 8.9–10.3)
Chloride: 103 mmol/L (ref 98–111)
Creatinine, Ser: 1 mg/dL (ref 0.44–1.00)
GFR, Estimated: >60 mL/min (ref >60)
Glucose, Bld: 126 mg/dL — ABNORMAL HIGH (ref 70–99)
Potassium: 3.4 mmol/L — ABNORMAL LOW (ref 3.5–5.1)
Sodium: 138 mmol/L (ref 135–145)
Total Bilirubin: 0.9 mg/dL (ref 0.3–1.2)
Total Protein: 7 g/dL (ref 6.5–8.1)

## 2022-07-05 LAB — URINALYSIS, ROUTINE W REFLEX MICROSCOPIC
Bilirubin Urine: NEGATIVE
Glucose, UA: NEGATIVE mg/dL
Ketones, ur: NEGATIVE mg/dL
Nitrite: NEGATIVE
Protein, ur: NEGATIVE mg/dL
Specific Gravity, Urine: 1.009 (ref 1.005–1.030)
pH: 8 (ref 5.0–8.0)

## 2022-07-05 LAB — SURGICAL PATHOLOGY

## 2022-07-05 LAB — LIPASE, BLOOD: Lipase: 29 U/L (ref 11–51)

## 2022-07-05 MED ORDER — HYDROMORPHONE HCL 1 MG/ML IJ SOLN
1.0000 mg | Freq: Once | INTRAMUSCULAR | Status: AC
  Administered 2022-07-05: 1 mg via INTRAVENOUS
  Filled 2022-07-05: qty 1

## 2022-07-05 MED ORDER — TAMSULOSIN HCL 0.4 MG PO CAPS: 0.4000 mg | ORAL_CAPSULE | Freq: Every day | ORAL | 0 refills | Status: DC

## 2022-07-05 MED ORDER — SODIUM CHLORIDE 0.9 % IV BOLUS
1000.0000 mL | Freq: Once | INTRAVENOUS | Status: AC
  Administered 2022-07-05: 1000 mL via INTRAVENOUS

## 2022-07-05 MED ORDER — ONDANSETRON HCL 4 MG/2ML IJ SOLN
4.0000 mg | Freq: Once | INTRAMUSCULAR | Status: AC
  Administered 2022-07-05: 4 mg via INTRAVENOUS
  Filled 2022-07-05: qty 2

## 2022-07-05 MED ORDER — IOHEXOL 300 MG/ML  SOLN
100.0000 mL | Freq: Once | INTRAMUSCULAR | Status: AC | PRN
  Administered 2022-07-05: 100 mL via INTRAVENOUS

## 2022-07-05 MED ORDER — ONDANSETRON 4 MG PO TBDP: 4.0000 mg | ORAL_TABLET | Freq: Three times a day (TID) | ORAL | 0 refills | Status: DC | PRN

## 2022-07-05 NOTE — Discharge Instructions (Addendum)
-   Continue home medications 

## 2022-07-05 NOTE — ED Triage Notes (Signed)
Pt brought in by spouse for rt side pain. Pt had gallbladder removal on Thursday and started having rt side pain starting this morning radiating to her back.

## 2022-07-05 NOTE — Anesthesia Postprocedure Evaluation (Signed)
Anesthesia Post Note  Patient: Brooke [name]  Procedure(s) Performed: XI ROBOTIC ASSISTED LAPAROSCOPIC CHOLECYSTECTOMY (Abdomen)  Patient location during evaluation: Phase II Anesthesia Type: General Level of consciousness: awake Pain management: pain level controlled Vital Signs Assessment: post-procedure vital signs reviewed and stable Respiratory status: spontaneous breathing and respiratory function stable Cardiovascular status: blood pressure returned to baseline and stable Postop Assessment: no headache and no apparent nausea or vomiting Anesthetic complications: no Comments: Late entry   No notable events documented.   Last Vitals:  Vitals:   07/01/22 1600 07/01/22 1615  BP: 125/80 129/72  Pulse: 86 89  Resp: 18 18  Temp:    SpO2: 97% 98%    Last Pain:  Vitals:   07/01/22 1615  TempSrc:   PainSc: 5                  Windell Norfolk

## 2022-07-06 ENCOUNTER — Encounter: Payer: 59 | Admitting: General Surgery

## 2022-07-06 NOTE — ED Provider Notes (Signed)
[place] EMERGENCY DEPARTMENT AT [facility] Provider Note   CSN: [id number] Arrival date & time: 07/05/22  1139     History  Chief Complaint  Patient presents with   Flank Pain    Brooke [name] is a 47 y.o. female.  Patient complains of right-sided abdominal and back pain.  Patient had a cholecystectomy done by Dr. Lovell Sheehan 4 days ago.  Patient reports she began having severe pain in her back and her abdomen.  Patient has a past medical history of kidney stones and she states this feels like a kidney stone.  Patient spoke with Dr. Lovell Sheehan general surgeon who advised her to come to the emergency department  The history is provided by the patient and the spouse.  Flank Pain This is a new problem. The problem occurs constantly. The problem has been gradually worsening. Associated symptoms include abdominal pain. Nothing aggravates the symptoms. Nothing relieves the symptoms. She has tried nothing for the symptoms. The treatment provided no relief.       Home Medications Prior to Admission medications   Medication Sig Start Date End Date Taking? Authorizing Provider  ondansetron (ZOFRAN-ODT) 4 MG disintegrating tablet Take 1 tablet (4 mg total) by mouth every 8 (eight) hours as needed for nausea or vomiting. 07/05/22  Yes Elson Areas, PA-C  tamsulosin (FLOMAX) 0.4 MG CAPS capsule Take 1 capsule (0.4 mg total) by mouth daily. 07/05/22  Yes Elson Areas, PA-C  Ascorbic Acid (VITAMIN C) 1000 MG tablet Take 1,000 mg by mouth daily.    [provider]  atorvastatin (LIPITOR) 20 MG tablet TAKE 1 TABLET DAILY FOR HIGH CHOLESTEROL. 03/01/22   Billie Lade, MD  Cholecalciferol (VITAMIN D3) 50 MCG (2000 UT) capsule Take 2,000 Units by mouth daily.    [provider]  CRANBERRY PO Take 15,000 mg by mouth daily.    [provider]  DULoxetine (CYMBALTA) 60 MG capsule TAKE 1 CAPSULE BY MOUTH ONCE DAILY. Patient taking differently: Take 120 mg by  mouth daily. 09/27/17   Aliene Beams, MD  esomeprazole (NEXIUM) 40 MG capsule TAKE 1 CAPSULE DAILY AT 12 NOON. 03/29/22   Billie Lade, MD  linaclotide Karlene Einstein) 145 MCG CAPS capsule Take one a day as needed Patient not taking: Reported on 06/28/2022 09/30/16   Aliene Beams, MD  lisinopril (ZESTRIL) 5 MG tablet TAKE 1 TABLET EVERY DAY BLOOD PRESSURE 06/22/22   Billie Lade, MD  medroxyPROGESTERone (DEPO-PROVERA) 150 MG/ML injection INJECT INTRAMUSCULARLY EVERY 3 MONTHS. 01/29/19   Adline Potter, NP  Menaquinone-7 (VITAMIN K2) 100 MCG CAPS Take 100 mcg by mouth daily.    [provider]  Menthol, Topical Analgesic, (BIOFREEZE EX) Apply 1 Application topically daily as needed (pain).    [provider]  Omega-3 Fatty Acids (FISH OIL) 1000 MG CAPS Take 1,000 mg by mouth daily.    [provider]  ondansetron (ZOFRAN) 4 MG tablet Take 1 tablet (4 mg total) by mouth every 8 (eight) hours as needed for nausea or vomiting. 07/01/22   Franky Macho, MD  oxyCODONE (ROXICODONE) 5 MG immediate release tablet Take 1 tablet (5 mg total) by mouth every 4 (four) hours as needed. 07/01/22 07/01/23  Franky Macho, MD  Oxycodone HCl 10 MG TABS Take 10 mg by mouth every 8 (eight) hours.    [provider]  polycarbophil (FIBERCON) 625 MG tablet Take 625 mg by mouth daily.    [provider]  Probiotic Product (PROBIOTIC  PO) Take 1 capsule by mouth daily.    [provider]  pyridOXINE (VITAMIN B6) 100 MG tablet Take 100 mg by mouth daily.    [provider]  SUMAtriptan (IMITREX) 100 MG tablet Take 1 tablet (100 mg total) by mouth every 2 (two) hours as needed for migraine. May repeat in 2 hours if headache persists or recurs. 09/30/16   Aliene Beams, MD  vitamin B-12 (CYANOCOBALAMIN) 500 MCG tablet Take 1,000 mcg by mouth daily.    [provider]  ZTLIDO 1.8 % PTCH Apply 2 patches topically daily. 06/07/22   [provider]       Allergies    Ciprofloxacin, Other, Sulfa antibiotics, and Zithromax [azithromycin]    Review of Systems   Review of Systems  Gastrointestinal:  Positive for abdominal pain.  Genitourinary:  Positive for flank pain.  All other systems reviewed and are negative.   Physical Exam Updated Vital Signs BP 125/79   Pulse 82   Temp 97.9 F (36.6 C) (Oral)   Resp 18   Ht 5\' 7"  (1.702 m)   Wt 106 kg   SpO2 95%   BMI 36.60 kg/m  Physical Exam Vitals and nursing note reviewed.  Constitutional:      Appearance: She is well-developed.  HENT:     Head: Normocephalic.     Mouth/Throat:     Mouth: Mucous membranes are moist.  Eyes:     Pupils: Pupils are equal, round, and reactive to light.  Cardiovascular:     Rate and Rhythm: Normal rate.  Pulmonary:     Effort: Pulmonary effort is normal.  Abdominal:     General: Abdomen is flat. There is no distension.     Palpations: There is no mass.     Tenderness: There is no abdominal tenderness.  Musculoskeletal:        General: Normal range of motion.     Cervical back: Normal range of motion.  Neurological:     Mental Status: She is alert and oriented to person, place, and time.  Psychiatric:        Mood and Affect: Mood normal.     ED Results / Procedures / Treatments   Labs (all labs ordered are listed, but only abnormal results are displayed) Labs Reviewed  CBC WITH DIFFERENTIAL/PLATELET - Abnormal; Notable for the following components:      Result Value   WBC 13.7 (*)    Hemoglobin 15.3 (*)    Neutro Abs 9.5 (*)    All other components within normal limits  COMPREHENSIVE METABOLIC PANEL - Abnormal; Notable for the following components:   Potassium 3.4 (*)    Glucose, Bld 126 (*)    All other components within normal limits  URINALYSIS, ROUTINE W REFLEX MICROSCOPIC - Abnormal; Notable for the following components:   Hgb urine dipstick LARGE (*)    Leukocytes,Ua TRACE (*)    Bacteria, UA RARE (*)    All other  components within normal limits  LIPASE, BLOOD    EKG None  Radiology CT ABDOMEN PELVIS W CONTRAST  Result Date: 07/05/2022 CLINICAL DATA:  Right-sided pain.  Recent gallbladder removal. EXAM: CT ABDOMEN AND PELVIS WITH CONTRAST TECHNIQUE: Multidetector CT imaging of the abdomen and pelvis was performed using the standard protocol following bolus administration of intravenous contrast. RADIATION DOSE REDUCTION: This exam was performed according to the departmental dose-optimization program which includes automated exposure control, adjustment of the mA and/or kV according to patient size and/or use  of iterative reconstruction technique. CONTRAST:  OMNIPAQUE IOHEXOL 300 MG/ML  SOLN COMPARISON:  Ultrasound 06/14/2022. CT 06/08/2022. Older exams as well. FINDINGS: Lower chest: Lung bases are clear. No pleural effusion. There are some small nodules identified such as left lower lobe on series 4, image 10 measuring 4 mm which is stable going back to 2018. No specific imaging follow-up. Hepatobiliary: No focal liver abnormality is seen. Status post cholecystectomy. No biliary dilatation. There is a small amount of fluid and stranding in the gallbladder fossa consistent with recent surgery. Pancreas: Unremarkable. No pancreatic ductal dilatation or surrounding inflammatory changes. Spleen: Normal in size without focal abnormality. Adrenals/Urinary Tract: Adrenal glands are preserved. Left kidney has some nonobstructing upper pole tiny renal stones measuring up to 2 mm. There is a lower pole left-sided 6 cm Bosniak 1 cyst. No specific follow-up. No left-sided collecting system dilatation. Left ureter has a normal course and caliber extending down to the bladder. Preserved contours of the urinary bladder. The right kidney is without enhancing mass. There is a punctate nonobstructing lower pole stone best seen on coronal imaging. There is however moderate right-sided renal collecting system dilatation present  down to the level of the UVJ where there is a focal stone measuring 6 mm. Stomach/Bowel: No oral contrast. The large bowel has a normal course and caliber with scattered stool. Small bowel is nondilated. Stomach is nondilated. No free intra-air. Vascular/Lymphatic: No significant vascular findings are present. No enlarged abdominal or pelvic lymph nodes. Reproductive: Uterus and bilateral adnexa are unremarkable. Other: No free intra-air. Musculoskeletal: There is some stranding along the anterior abdominal wall with some bubbles of air consistent with patient's recent surgery. Slight curvature of the lumbar spine with degenerative changes greatest at L4-5. There is also mild degenerative changes along the pelvis. IMPRESSION: Recent surgical changes of cholecystectomy. Trace fluid and stranding in the gallbladder fossa. 6 mm right UVJ stone with proximal right renal collecting system dilatation. Several other bilateral nonobstructing renal stones. Electronically Signed   By: Karen Kays M.D.   On: 07/05/2022 14:20    Procedures Procedures    Medications Ordered in ED Medications  HYDROmorphone (DILAUDID) injection 1 mg (1 mg Intravenous Given 07/05/22 1205)  ondansetron (ZOFRAN) injection 4 mg (4 mg Intravenous Given 07/05/22 1205)  sodium chloride 0.9 % bolus 1,000 mL (0 mLs Intravenous Stopped 07/05/22 1518)  HYDROmorphone (DILAUDID) injection 1 mg (1 mg Intravenous Given 07/05/22 1246)  iohexol (OMNIPAQUE) 300 MG/ML solution 100 mL (100 mLs Intravenous Contrast Given 07/05/22 1319)  HYDROmorphone (DILAUDID) injection 1 mg (1 mg Intravenous Given 07/05/22 1506)    ED Course/ Medical Decision Making/ A&P                             Medical Decision Making Patient complains of right-sided back and right-sided abdominal pain.  Patient had a cholecystectomy 4 days ago  Amount and/or Complexity of Data Reviewed Independent Historian: spouse    Details: Patient is here with her husband who is  supportive External Data Reviewed: notes.    Details: Neurosurgery notes reviewed Labs: ordered. Decision-making details documented in ED Course.    Details: Labs ordered reviewed and interpreted.  Patient has a white blood cell count of 13.7.  UA shows large blood Radiology: ordered and independent interpretation performed. Decision-making details documented in ED Course.    Details: CT scan ordered reviewed and interpreted send patient has a 6 mm stone at the UVJ.  Patient has hydronephrosis seen on the CT Discussion of management or test interpretation with external provider(s): I discussed with Dr. Lovell Sheehan general surgeon.  Dr. Lovell Sheehan saw and evaluated the patient here.  Dr. Lovell Sheehan spoke with Dr. Ronne Binning urologist who will schedule follow-up for patient.  Risk Prescription drug management. Risk Details: Patient is counseled on results she is advised to follow-up with Dr. Ronne Binning.  Patient is advised to continue home medications she is given a prescription for Zofran and Flomax.           Final Clinical Impression(s) / ED Diagnoses Final diagnoses:  Ureterolithiasis    Rx / DC Orders ED Discharge Orders          Ordered    tamsulosin (FLOMAX) 0.4 MG CAPS capsule  Daily        07/05/22 1451    ondansetron (ZOFRAN-ODT) 4 MG disintegrating tablet  Every 8 hours PRN        07/05/22 1451           An After Visit Summary was printed and given to the patient.    Elson Areas, PA-C 07/06/22 1500    Sloan Leiter, DO 07/08/22 [phone]

## 2022-07-07 ENCOUNTER — Encounter: Payer: 59 | Admitting: Orthopaedic Surgery

## 2022-07-07 ENCOUNTER — Encounter: Payer: Self-pay | Admitting: General Surgery

## 2022-07-08 ENCOUNTER — Encounter: Payer: Self-pay | Admitting: Internal Medicine

## 2022-07-10 DIAGNOSIS — I10 Essential (primary) hypertension: Secondary | ICD-10-CM | POA: Diagnosis not present

## 2022-07-10 DIAGNOSIS — N390 Urinary tract infection, site not specified: Secondary | ICD-10-CM | POA: Diagnosis not present

## 2022-07-13 DIAGNOSIS — M5416 Radiculopathy, lumbar region: Secondary | ICD-10-CM | POA: Diagnosis not present

## 2022-07-13 DIAGNOSIS — M545 Low back pain, unspecified: Secondary | ICD-10-CM | POA: Diagnosis not present

## 2022-07-13 DIAGNOSIS — M25552 Pain in left hip: Secondary | ICD-10-CM | POA: Diagnosis not present

## 2022-07-13 DIAGNOSIS — G894 Chronic pain syndrome: Secondary | ICD-10-CM | POA: Diagnosis not present

## 2022-07-13 DIAGNOSIS — Z79891 Long term (current) use of opiate analgesic: Secondary | ICD-10-CM | POA: Diagnosis not present

## 2022-07-13 DIAGNOSIS — M25511 Pain in right shoulder: Secondary | ICD-10-CM | POA: Diagnosis not present

## 2022-07-13 DIAGNOSIS — M5412 Radiculopathy, cervical region: Secondary | ICD-10-CM | POA: Diagnosis not present

## 2022-07-13 DIAGNOSIS — M5432 Sciatica, left side: Secondary | ICD-10-CM | POA: Diagnosis not present

## 2022-07-13 DIAGNOSIS — M25512 Pain in left shoulder: Secondary | ICD-10-CM | POA: Diagnosis not present

## 2022-07-13 DIAGNOSIS — M546 Pain in thoracic spine: Secondary | ICD-10-CM | POA: Diagnosis not present

## 2022-07-13 DIAGNOSIS — Z79899 Other long term (current) drug therapy: Secondary | ICD-10-CM | POA: Diagnosis not present

## 2022-07-14 ENCOUNTER — Telehealth: Payer: Self-pay

## 2022-07-14 NOTE — Telephone Encounter (Signed)
Our office received a ED/US Referral Summary from [facility].  Diagnosis: Ureterolithiasis  No answer. Unable to leave a voicemail.

## 2022-07-19 ENCOUNTER — Other Ambulatory Visit: Payer: Self-pay | Admitting: Internal Medicine

## 2022-07-19 ENCOUNTER — Encounter: Payer: Self-pay | Admitting: Internal Medicine

## 2022-07-19 ENCOUNTER — Other Ambulatory Visit: Payer: Self-pay

## 2022-07-19 DIAGNOSIS — K5909 Other constipation: Secondary | ICD-10-CM

## 2022-07-19 MED ORDER — LINACLOTIDE 145 MCG PO CAPS
ORAL_CAPSULE | ORAL | 0 refills | Status: DC
Start: 2022-07-19 — End: 2022-08-18

## 2022-07-20 DIAGNOSIS — Z79891 Long term (current) use of opiate analgesic: Secondary | ICD-10-CM | POA: Diagnosis not present

## 2022-07-20 DIAGNOSIS — M5416 Radiculopathy, lumbar region: Secondary | ICD-10-CM | POA: Diagnosis not present

## 2022-07-20 DIAGNOSIS — G894 Chronic pain syndrome: Secondary | ICD-10-CM | POA: Diagnosis not present

## 2022-07-20 DIAGNOSIS — M546 Pain in thoracic spine: Secondary | ICD-10-CM | POA: Diagnosis not present

## 2022-07-29 ENCOUNTER — Ambulatory Visit (INDEPENDENT_AMBULATORY_CARE_PROVIDER_SITE_OTHER): Payer: 59

## 2022-07-29 ENCOUNTER — Telehealth: Payer: Self-pay

## 2022-07-29 DIAGNOSIS — Z789 Other specified health status: Secondary | ICD-10-CM

## 2022-07-29 MED ORDER — MEDROXYPROGESTERONE ACETATE 150 MG/ML IM SUSP
150.0000 mg | Freq: Once | INTRAMUSCULAR | Status: AC
Start: 2022-07-29 — End: 2022-07-29
  Administered 2022-07-29: 150 mg via INTRAMUSCULAR

## 2022-07-29 NOTE — Telephone Encounter (Signed)
Patient came by the office to get Depo and is asking about cramping in her legs. She states that she is still having them and would like to know what to do. She said you can send her a Clinical cytogeneticist message or call.

## 2022-08-04 ENCOUNTER — Ambulatory Visit: Payer: 59

## 2022-08-05 ENCOUNTER — Ambulatory Visit: Payer: 59

## 2022-08-10 ENCOUNTER — Encounter: Payer: 59 | Admitting: Physician Assistant

## 2022-08-11 NOTE — Telephone Encounter (Signed)
I called patient to find out why she did not show for surgery.  She stated she didn't have the gas money.  She will call back when ready to reschedule.

## 2022-08-12 ENCOUNTER — Encounter: Payer: 59 | Admitting: Orthopaedic Surgery

## 2022-08-16 DIAGNOSIS — M545 Low back pain, unspecified: Secondary | ICD-10-CM | POA: Diagnosis not present

## 2022-08-16 DIAGNOSIS — G894 Chronic pain syndrome: Secondary | ICD-10-CM | POA: Diagnosis not present

## 2022-08-16 DIAGNOSIS — M546 Pain in thoracic spine: Secondary | ICD-10-CM | POA: Diagnosis not present

## 2022-08-16 DIAGNOSIS — M25512 Pain in left shoulder: Secondary | ICD-10-CM | POA: Diagnosis not present

## 2022-08-16 DIAGNOSIS — M5432 Sciatica, left side: Secondary | ICD-10-CM | POA: Diagnosis not present

## 2022-08-16 DIAGNOSIS — Z79899 Other long term (current) drug therapy: Secondary | ICD-10-CM | POA: Diagnosis not present

## 2022-08-16 DIAGNOSIS — M25511 Pain in right shoulder: Secondary | ICD-10-CM | POA: Diagnosis not present

## 2022-08-16 DIAGNOSIS — M25552 Pain in left hip: Secondary | ICD-10-CM | POA: Diagnosis not present

## 2022-08-16 DIAGNOSIS — M5416 Radiculopathy, lumbar region: Secondary | ICD-10-CM | POA: Diagnosis not present

## 2022-08-16 DIAGNOSIS — Z79891 Long term (current) use of opiate analgesic: Secondary | ICD-10-CM | POA: Diagnosis not present

## 2022-08-16 DIAGNOSIS — M5412 Radiculopathy, cervical region: Secondary | ICD-10-CM | POA: Diagnosis not present

## 2022-08-18 ENCOUNTER — Other Ambulatory Visit: Payer: Self-pay | Admitting: Internal Medicine

## 2022-08-18 DIAGNOSIS — K5909 Other constipation: Secondary | ICD-10-CM

## 2022-08-20 DIAGNOSIS — M546 Pain in thoracic spine: Secondary | ICD-10-CM | POA: Diagnosis not present

## 2022-08-20 DIAGNOSIS — G894 Chronic pain syndrome: Secondary | ICD-10-CM | POA: Diagnosis not present

## 2022-08-20 DIAGNOSIS — M5416 Radiculopathy, lumbar region: Secondary | ICD-10-CM | POA: Diagnosis not present

## 2022-08-20 DIAGNOSIS — Z79891 Long term (current) use of opiate analgesic: Secondary | ICD-10-CM | POA: Diagnosis not present

## 2022-08-25 ENCOUNTER — Other Ambulatory Visit: Payer: Self-pay | Admitting: Internal Medicine

## 2022-08-25 ENCOUNTER — Encounter: Payer: Self-pay | Admitting: Internal Medicine

## 2022-08-25 DIAGNOSIS — K5909 Other constipation: Secondary | ICD-10-CM

## 2022-09-20 DIAGNOSIS — G894 Chronic pain syndrome: Secondary | ICD-10-CM | POA: Diagnosis not present

## 2022-09-20 DIAGNOSIS — Z79891 Long term (current) use of opiate analgesic: Secondary | ICD-10-CM | POA: Diagnosis not present

## 2022-09-20 DIAGNOSIS — M546 Pain in thoracic spine: Secondary | ICD-10-CM | POA: Diagnosis not present

## 2022-09-20 DIAGNOSIS — M5416 Radiculopathy, lumbar region: Secondary | ICD-10-CM | POA: Diagnosis not present

## 2022-09-22 ENCOUNTER — Other Ambulatory Visit: Payer: Self-pay | Admitting: Internal Medicine

## 2022-09-29 ENCOUNTER — Encounter: Payer: Self-pay | Admitting: Internal Medicine

## 2022-09-30 ENCOUNTER — Ambulatory Visit: Payer: 59 | Admitting: Internal Medicine

## 2022-09-30 DIAGNOSIS — L0390 Cellulitis, unspecified: Secondary | ICD-10-CM | POA: Diagnosis not present

## 2022-09-30 DIAGNOSIS — R21 Rash and other nonspecific skin eruption: Secondary | ICD-10-CM | POA: Diagnosis not present

## 2022-09-30 DIAGNOSIS — I10 Essential (primary) hypertension: Secondary | ICD-10-CM | POA: Diagnosis not present

## 2022-10-10 ENCOUNTER — Encounter (HOSPITAL_COMMUNITY): Payer: Self-pay

## 2022-10-10 ENCOUNTER — Other Ambulatory Visit: Payer: Self-pay

## 2022-10-10 ENCOUNTER — Emergency Department (HOSPITAL_COMMUNITY)
Admission: EM | Admit: 2022-10-10 | Discharge: 2022-10-10 | Disposition: A | Discharge status: Home / Self Care | Payer: 59 | Attending: Emergency Medicine | Admitting: Emergency Medicine

## 2022-10-10 DIAGNOSIS — F17200 Nicotine dependence, unspecified, uncomplicated: Secondary | ICD-10-CM | POA: Insufficient documentation

## 2022-10-10 DIAGNOSIS — L299 Pruritus, unspecified: Secondary | ICD-10-CM | POA: Insufficient documentation

## 2022-10-10 DIAGNOSIS — R21 Rash and other nonspecific skin eruption: Secondary | ICD-10-CM | POA: Diagnosis not present

## 2022-10-10 MED ORDER — TRIAMCINOLONE ACETONIDE 0.1 % EX CREA: 1.0000 | TOPICAL_CREAM | Freq: Two times a day (BID) | CUTANEOUS | 0 refills | Status: DC

## 2022-10-10 MED ORDER — HYDROXYZINE HCL 25 MG PO TABS: 25.0000 mg | ORAL_TABLET | Freq: Four times a day (QID) | ORAL | 0 refills | Status: DC | PRN

## 2022-10-10 MED ORDER — HYDROXYZINE HCL 25 MG PO TABS
50.0000 mg | ORAL_TABLET | Freq: Once | ORAL | Status: AC
  Administered 2022-10-10: 50 mg via ORAL
  Filled 2022-10-10: qty 2

## 2022-10-10 NOTE — ED Notes (Signed)
Pt has hx of hypertension

## 2022-10-10 NOTE — ED Triage Notes (Signed)
C/o rash to neck and face that is itchy x2 weeks.  Was placed on keflex and prednisone for cellulitis with last dose Thursday.  Aloe, calizime, hydrocortisone, benadryl, ice packs w/o relief

## 2022-10-10 NOTE — Discharge Instructions (Addendum)
If there was initially an infectious rash on the neck it does seem to be resolved at this time.  The ongoing rash seems secondary to the amount of scratching that you are doing.  It is not going to completely go away until you stop scratching.  I prescribed a medication hydroxyzine which you can use for itching in addition to Benadryl, you can use the stronger topical steroid I prescribed up to twice daily.  As well as I recommend that you pick up a medication called Sarna anti-itch cream which is over-the-counter and can help with itching in addition to the above.

## 2022-10-10 NOTE — ED Provider Notes (Signed)
[place] EMERGENCY DEPARTMENT AT [facility] Provider Note   CSN: [id number] Arrival date & time: 10/10/22  1835     History  Chief Complaint  Patient presents with   Rash    Brooke [name] is a 47 y.o. female with past medical history significant for obesity, tobacco abuse, acid reflux who presents with concern for ongoing rash to neck that is itchy for 2 weeks.  Patient reports that she went to urgent care/her primary care doctor, was placed on Keflex, prednisone, she has been using hydrocortisone cream, taking Benadryl, but continues to have rash, itching.  Patient reports that she is spending a lot of time scratching the affected area.  She reports it is interrupting her sleep.  She denies any fever, chills.  She denies any pus or drainage from the site.  She reports there was initially some blistering which has since resolved.  No previous history of eczema.   Rash      Home Medications Prior to Admission medications   Medication Sig Start Date End Date Taking? Authorizing Provider  hydrOXYzine (ATARAX) 25 MG tablet Take 1 tablet (25 mg total) by mouth every 6 (six) hours as needed. 10/10/22  Yes Barry Faircloth H, PA-C  triamcinolone cream (KENALOG) 0.1 % Apply 1 Application topically 2 (two) times daily. Do not use for longer than 2 weeks to anyone affected area 10/10/22  Yes Shimon Trowbridge H, PA-C  Ascorbic Acid (VITAMIN C) 1000 MG tablet Take 1,000 mg by mouth daily.    [provider]  atorvastatin (LIPITOR) 20 MG tablet TAKE 1 TABLET DAILY FOR HIGH CHOLESTEROL. 03/01/22   Billie Lade, MD  Cholecalciferol (VITAMIN D3) 50 MCG (2000 UT) capsule Take 2,000 Units by mouth daily.    [provider]  CRANBERRY PO Take 15,000 mg by mouth daily.    [provider]  DULoxetine (CYMBALTA) 60 MG capsule TAKE 1 CAPSULE BY MOUTH ONCE DAILY. Patient taking differently: Take 120 mg by mouth daily. 09/27/17   Aliene Beams, MD   esomeprazole (NEXIUM) 40 MG capsule TAKE 1 CAPSULE DAILY AT 12 NOON. 03/29/22   Billie Lade, MD  linaclotide [facility]) 145 MCG CAPS capsule take one a day as needed 08/25/22   Billie Lade, MD  lisinopril (ZESTRIL) 5 MG tablet take 1 tablet every day blood pressure 09/22/22   Billie Lade, MD  medroxyPROGESTERone (DEPO-PROVERA) 150 MG/ML injection INJECT INTRAMUSCULARLY EVERY 3 MONTHS. 01/29/19   Adline Potter, NP  Menaquinone-7 (VITAMIN K2) 100 MCG CAPS Take 100 mcg by mouth daily.    [provider]  Menthol, Topical Analgesic, (BIOFREEZE EX) Apply 1 Application topically daily as needed (pain).    [provider]  Omega-3 Fatty Acids (FISH OIL) 1000 MG CAPS Take 1,000 mg by mouth daily.    [provider]  ondansetron (ZOFRAN) 4 MG tablet Take 1 tablet (4 mg total) by mouth every 8 (eight) hours as needed for nausea or vomiting. 07/01/22   Franky Macho, MD  ondansetron (ZOFRAN-ODT) 4 MG disintegrating tablet Take 1 tablet (4 mg total) by mouth every 8 (eight) hours as needed for nausea or vomiting. 07/05/22   Elson Areas, PA-C  oxyCODONE (ROXICODONE) 5 MG immediate release tablet Take 1 tablet (5 mg total) by mouth every 4 (four) hours as needed. 07/01/22 07/01/23  Franky Macho, MD  Oxycodone HCl 10 MG TABS Take 10 mg by mouth every 8 (eight) hours.    [provider]  polycarbophil (FIBERCON) 625 MG tablet Take 625 mg by mouth daily.    [provider]  Probiotic Product (PROBIOTIC PO) Take 1 capsule by mouth daily.    [provider]  pyridOXINE (VITAMIN B6) 100 MG tablet Take 100 mg by mouth daily.    [provider]  SUMAtriptan (IMITREX) 100 MG tablet Take 1 tablet (100 mg total) by mouth every 2 (two) hours as needed for migraine. May repeat in 2 hours if headache persists or recurs. 09/30/16   Aliene Beams, MD  tamsulosin (FLOMAX) 0.4 MG CAPS capsule Take 1 capsule (0.4 mg total) by mouth daily. 07/05/22    Elson Areas, PA-C  vitamin B-12 (CYANOCOBALAMIN) 500 MCG tablet Take 1,000 mcg by mouth daily.    [provider]  ZTLIDO 1.8 % PTCH Apply 2 patches topically daily. 06/07/22   [provider]      Allergies    Ciprofloxacin, Other, Sulfa antibiotics, and Zithromax [azithromycin]    Review of Systems   Review of Systems  Skin:  Positive for rash.  All other systems reviewed and are negative.   Physical Exam Updated Vital Signs BP 138/87   Pulse 89   Temp 98.2 F (36.8 C)   Resp 18   Ht 5\' 7"  (1.702 m)   Wt 106.6 kg   SpO2 97%   BMI 36.81 kg/m  Physical Exam Vitals and nursing note reviewed.  Constitutional:      General: She is not in acute distress.    Appearance: Normal appearance.  HENT:     Head: Normocephalic and atraumatic.  Eyes:     General:        Right eye: No discharge.        Left eye: No discharge.  Cardiovascular:     Rate and Rhythm: Normal rate and regular rhythm.  Pulmonary:     Effort: Pulmonary effort is normal. No respiratory distress.  Musculoskeletal:        General: No deformity.  Skin:    General: Skin is warm and dry.     Comments: Patient with red thickened skin on the anterior surface of the neck, with some ecchymosis.  Frequent scratching noted even during brief exam.  No evidence of pustules, fluctuance, no focal abscess noted.  Neurological:     Mental Status: She is alert and oriented to person, place, and time.  Psychiatric:        Mood and Affect: Mood normal.        Behavior: Behavior normal.     ED Results / Procedures / Treatments   Labs (all labs ordered are listed, but only abnormal results are displayed) Labs Reviewed - No data to display  EKG None  Radiology No results found.  Procedures Procedures    Medications Ordered in ED Medications  hydrOXYzine (ATARAX) tablet 50 mg (has no administration in time range)    ED Course/ Medical Decision Making/ A&P                                  Medical Decision Making Risk Prescription drug management.  This patient is a 47 y.o. female who presents to the ED for concern of rash of neck with pruritus.   Differential diagnoses prior to evaluation: Cellulitis, dermatitis, eczema, psoriasis, versus other  Past Medical History / Social History / Additional history: Chart reviewed. Pertinent results include: Obesity, acid reflux, tobacco abuse  Physical Exam: Physical exam performed. The pertinent findings include:  Patient with red thickened skin on the anterior surface of the neck, with some ecchymosis.  Frequent scratching noted even during brief exam.  No evidence of pustules, fluctuance, no focal abscess noted.   Medications / Treatment: Atarax administered in the emergency department, discharged with triamcinolone, encouraged Sarna anti-itch lotion, and cessation of scratching.  Patient advised that the itching sensation is not going to go away if she continues to scratch it due to histamine release.  No evidence of secondary infection at this time.  No indication for further systemic steroids.   Disposition: After consideration of the diagnostic results and the patients response to treatment, I feel that patient with pruritic dermatitis as discussed above, treatment as above,.   emergency department workup does not suggest an emergent condition requiring admission or immediate intervention beyond what has been performed at this time. The plan is: Detailed above and treatment plan. The patient is safe for discharge and has been instructed to return immediately for worsening symptoms, change in symptoms or any other concerns.  Final Clinical Impression(s) / ED Diagnoses Final diagnoses:  Rash  Pruritus    Rx / DC Orders ED Discharge Orders          Ordered    hydrOXYzine (ATARAX) 25 MG tablet  Every 6 hours PRN        10/10/22 2018    triamcinolone cream (KENALOG) 0.1 %  2 times daily        10/10/22 2018               [place] 10/10/22 2024    Pricilla Loveless, MD 10/18/22 1545

## 2022-10-11 ENCOUNTER — Other Ambulatory Visit: Payer: Self-pay | Admitting: Internal Medicine

## 2022-10-18 ENCOUNTER — Ambulatory Visit: Payer: 59 | Admitting: Internal Medicine

## 2022-10-20 DIAGNOSIS — M5416 Radiculopathy, lumbar region: Secondary | ICD-10-CM | POA: Diagnosis not present

## 2022-10-20 DIAGNOSIS — M546 Pain in thoracic spine: Secondary | ICD-10-CM | POA: Diagnosis not present

## 2022-10-20 DIAGNOSIS — G894 Chronic pain syndrome: Secondary | ICD-10-CM | POA: Diagnosis not present

## 2022-10-20 DIAGNOSIS — Z79891 Long term (current) use of opiate analgesic: Secondary | ICD-10-CM | POA: Diagnosis not present

## 2022-10-29 ENCOUNTER — Other Ambulatory Visit: Payer: Self-pay | Admitting: Internal Medicine

## 2022-11-01 ENCOUNTER — Other Ambulatory Visit: Payer: Self-pay | Admitting: Adult Health

## 2022-11-03 ENCOUNTER — Encounter: Payer: Self-pay | Admitting: Internal Medicine

## 2022-11-03 ENCOUNTER — Other Ambulatory Visit: Payer: Self-pay

## 2022-11-04 ENCOUNTER — Ambulatory Visit (INDEPENDENT_AMBULATORY_CARE_PROVIDER_SITE_OTHER): Payer: 59

## 2022-11-04 ENCOUNTER — Other Ambulatory Visit: Payer: Self-pay

## 2022-11-04 DIAGNOSIS — Z789 Other specified health status: Secondary | ICD-10-CM | POA: Diagnosis not present

## 2022-11-04 DIAGNOSIS — B9689 Other specified bacterial agents as the cause of diseases classified elsewhere: Secondary | ICD-10-CM

## 2022-11-04 MED ORDER — MEDROXYPROGESTERONE ACETATE 150 MG/ML IM SUSP
150.0000 mg | Freq: Once | INTRAMUSCULAR | Status: AC
  Administered 2022-11-04: 150 mg via INTRAMUSCULAR

## 2022-11-04 MED ORDER — MOMETASONE FUROATE 50 MCG/ACT NA SUSP
2.0000 | Freq: Every day | NASAL | 12 refills | Status: AC
Start: 2022-11-04 — End: ?

## 2022-11-04 MED ORDER — CEFDINIR 300 MG PO CAPS
300.0000 mg | ORAL_CAPSULE | Freq: Two times a day (BID) | ORAL | 0 refills | Status: AC
Start: 2022-11-04 — End: 2022-11-14

## 2022-11-04 MED ORDER — IPRATROPIUM BROMIDE 0.06 % NA SOLN: 2.0000 | Freq: Two times a day (BID) | NASAL | 12 refills | Status: AC

## 2022-11-09 ENCOUNTER — Telehealth: Payer: Self-pay

## 2022-11-09 NOTE — Telephone Encounter (Signed)
Transition Care Management Unsuccessful Follow-up Telephone Call  Date of discharge and from where:  10/10/2022 [facility]  Attempts:  1st Attempt  Reason for unsuccessful TCM follow-up call:  Left voice message  Mac Dowdell [facility]  [facility], [facility] Resource Care Guide Direct Dial: [phone]  Website: [email or web address]

## 2022-11-10 ENCOUNTER — Telehealth: Payer: Self-pay

## 2022-11-10 NOTE — Telephone Encounter (Signed)
Transition Care Management Unsuccessful Follow-up Telephone Call  Date of discharge and from where:  10/10/2022 [facility]  Attempts:  2nd Attempt  Reason for unsuccessful TCM follow-up call:  Left voice message  Azriel Dancy [facility]  [facility], [facility] Resource Care Guide Direct Dial: [phone]  Website: [email or web address]

## 2022-11-20 DIAGNOSIS — Z79891 Long term (current) use of opiate analgesic: Secondary | ICD-10-CM | POA: Diagnosis not present

## 2022-11-20 DIAGNOSIS — M546 Pain in thoracic spine: Secondary | ICD-10-CM | POA: Diagnosis not present

## 2022-11-20 DIAGNOSIS — G894 Chronic pain syndrome: Secondary | ICD-10-CM | POA: Diagnosis not present

## 2022-11-20 DIAGNOSIS — M5416 Radiculopathy, lumbar region: Secondary | ICD-10-CM | POA: Diagnosis not present

## 2022-11-22 ENCOUNTER — Ambulatory Visit: Payer: 59 | Admitting: Internal Medicine

## 2022-12-14 ENCOUNTER — Ambulatory Visit: Payer: 59 | Admitting: Adult Health

## 2022-12-20 DIAGNOSIS — G894 Chronic pain syndrome: Secondary | ICD-10-CM | POA: Diagnosis not present

## 2022-12-20 DIAGNOSIS — M5416 Radiculopathy, lumbar region: Secondary | ICD-10-CM | POA: Diagnosis not present

## 2022-12-20 DIAGNOSIS — M546 Pain in thoracic spine: Secondary | ICD-10-CM | POA: Diagnosis not present

## 2022-12-20 DIAGNOSIS — Z79891 Long term (current) use of opiate analgesic: Secondary | ICD-10-CM | POA: Diagnosis not present

## 2022-12-23 ENCOUNTER — Other Ambulatory Visit: Payer: Self-pay | Admitting: Internal Medicine

## 2022-12-23 DIAGNOSIS — F320 Major depressive disorder, single episode, mild: Secondary | ICD-10-CM

## 2023-01-12 ENCOUNTER — Encounter: Payer: Self-pay | Admitting: Internal Medicine

## 2023-01-13 ENCOUNTER — Telehealth (INDEPENDENT_AMBULATORY_CARE_PROVIDER_SITE_OTHER): Payer: 59 | Admitting: Family Medicine

## 2023-01-13 ENCOUNTER — Encounter: Payer: Self-pay | Admitting: Family Medicine

## 2023-01-13 VITALS — Ht 66.0 in | Wt 237.0 lb

## 2023-01-13 DIAGNOSIS — K047 Periapical abscess without sinus: Secondary | ICD-10-CM | POA: Diagnosis not present

## 2023-01-13 MED ORDER — AMOXICILLIN-POT CLAVULANATE 875-125 MG PO TABS: 1.0000 | ORAL_TABLET | Freq: Two times a day (BID) | ORAL | 0 refills | Status: AC

## 2023-01-13 NOTE — Progress Notes (Signed)
 Virtual Visit via Video Note  I connected with Brooke [name] on 01/13/23 at  8:00 AM EST by a video enabled telemedicine application and verified that I am speaking with the correct person using two identifiers.  Patient Location: Home Provider Location: Office/Clinic  I discussed the limitations, risks, security, and privacy concerns of performing an evaluation and management service by video and the availability of in person appointments. I also discussed with the patient that there may be a patient responsible charge related to this service. The patient expressed understanding and agreed to proceed.  Subjective: PCP: Melvenia Manus BRAVO, MD  Chief Complaint  Patient presents with   Dental Pain    Pt reports tooth broke about a month 1/2 ago has been breaking off more since then,    HPI The patient presents today with complaints of a broken tooth accompanied by greenish, foul-smelling discharge, fever, chills, and swelling. She reports having a dental appointment in two weeks and requests a prescription for antibiotics to treat her current tooth abscess.   ROS: Per HPI  Current Outpatient Medications:    amoxicillin -clavulanate (AUGMENTIN ) 875-125 MG tablet, Take 1 tablet by mouth 2 (two) times daily for 7 days., Disp: 14 tablet, Rfl: 0   Ascorbic Acid (VITAMIN C) 1000 MG tablet, Take 1,000 mg by mouth daily., Disp: , Rfl:    atorvastatin (LIPITOR) 20 MG tablet, TAKE 1 TABLET DAILY FOR HIGH CHOLESTEROL., Disp: 30 tablet, Rfl: 0   Cholecalciferol (VITAMIN D3) 50 MCG (2000 UT) capsule, Take 2,000 Units by mouth daily., Disp: , Rfl:    CRANBERRY PO, Take 15,000 mg by mouth daily., Disp: , Rfl:    DULoxetine  (CYMBALTA ) 60 MG capsule, take 1 capsule by mouth twice daily., Disp: 180 capsule, Rfl: 0   esomeprazole  (NEXIUM ) 40 MG capsule, TAKE 1 CAPSULE DAILY AT 12 NOON., Disp: 90 capsule, Rfl: 3   hydrOXYzine  (ATARAX ) 25 MG tablet, Take 1 tablet (25 mg total) by mouth every 6 (six)  hours as needed., Disp: 20 tablet, Rfl: 0   ipratropium (ATROVENT ) 0.06 % nasal spray, Place 2 sprays into both nostrils 2 (two) times daily., Disp: 15 mL, Rfl: 12   linaclotide  (LINZESS ) 145 MCG CAPS capsule, take one a day as needed, Disp: 30 capsule, Rfl: 0   lisinopril (ZESTRIL) 5 MG tablet, take 1 tablet every day blood pressure, Disp: 30 tablet, Rfl: 0   medroxyPROGESTERone  Acetate 150 MG/ML SUSY, INJECT 1 ML IM EVERY 84 DAYS., Disp: 1 mL, Rfl: 3   Menaquinone-7 (VITAMIN K2) 100 MCG CAPS, Take 100 mcg by mouth daily., Disp: , Rfl:    Menthol, Topical Analgesic, (BIOFREEZE EX), Apply 1 Application topically daily as needed (pain)., Disp: , Rfl:    mometasone  (NASONEX ) 50 MCG/ACT nasal spray, Place 2 sprays into the nose daily., Disp: 1 each, Rfl: 12   naloxone (NARCAN) nasal spray 4 mg/0.1 mL, Place 0.4 mg into the nose once., Disp: , Rfl:    Omega-3 Fatty Acids (FISH OIL) 1000 MG CAPS, Take 1,000 mg by mouth daily., Disp: , Rfl:    ondansetron  (ZOFRAN ) 4 MG tablet, Take 1 tablet (4 mg total) by mouth every 8 (eight) hours as needed for nausea or vomiting., Disp: 20 tablet, Rfl: 0   ondansetron  (ZOFRAN -ODT) 4 MG disintegrating tablet, Take 1 tablet (4 mg total) by mouth every 8 (eight) hours as needed for nausea or vomiting., Disp: 20 tablet, Rfl: 0   oxyCODONE  (ROXICODONE ) 5 MG immediate release tablet, Take 1 tablet (5  mg total) by mouth every 4 (four) hours as needed., Disp: 25 tablet, Rfl: 0   Oxycodone  HCl 10 MG TABS, Take 10 mg by mouth every 8 (eight) hours., Disp: , Rfl:    polycarbophil (FIBERCON) 625 MG tablet, Take 625 mg by mouth daily., Disp: , Rfl:    Probiotic Product (PROBIOTIC PO), Take 1 capsule by mouth daily., Disp: , Rfl:    pyridOXINE (VITAMIN B6) 100 MG tablet, Take 100 mg by mouth daily., Disp: , Rfl:    SUMAtriptan  (IMITREX ) 100 MG tablet, Take 1 tablet (100 mg total) by mouth every 2 (two) hours as needed for migraine. May repeat in 2 hours if headache persists or  recurs., Disp: 9 tablet, Rfl: 3   tamsulosin  (FLOMAX ) 0.4 MG CAPS capsule, Take 1 capsule (0.4 mg total) by mouth daily., Disp: 30 capsule, Rfl: 0   triamcinolone  cream (KENALOG ) 0.1 %, Apply 1 Application topically 2 (two) times daily. Do not use for longer than 2 weeks to anyone affected area, Disp: 45 g, Rfl: 0   vitamin B-12 (CYANOCOBALAMIN ) 500 MCG tablet, Take 1,000 mcg by mouth daily., Disp: , Rfl:    ZTLIDO  1.8 % PTCH, Apply 2 patches topically daily., Disp: , Rfl:   Observations/Objective: Today's Vitals   01/13/23 0803  Weight: 237 lb (107.5 kg)  Height: 5' 6 (1.676 m)  PainSc: 7   PainLoc: Mouth   Physical Exam  Head is normocephalic, atraumatic.  No labored breathing.  Speech is clear and coherent with logical content.  Patient is alert and oriented at baseline.   Assessment and Plan: Dental abscess -     Amoxicillin -Pot Clavulanate; Take 1 tablet by mouth 2 (two) times daily for 7 days.  Dispense: 14 tablet; Refill: 0  Encouraged warm salt rinses to reduce bacteria.  Apply cold compresses to the affected side of the face for 20 minutes to reduce swelling.  Avoid hot, spicy, and acidic foods, and maintain oral hygiene.   Follow Up Instructions: No follow-ups on file.   I discussed the assessment and treatment plan with the patient. The patient was provided an opportunity to ask questions, and all were answered. The patient agreed with the plan and demonstrated an understanding of the instructions.   The patient was advised to call back or seek an in-person evaluation if the symptoms worsen or if the condition fails to improve as anticipated.  The above assessment and management plan was discussed with the patient. The patient verbalized understanding of and has agreed to the management plan.   Brooke Petruzzi, FNP

## 2023-01-20 DIAGNOSIS — M5416 Radiculopathy, lumbar region: Secondary | ICD-10-CM | POA: Diagnosis not present

## 2023-01-20 DIAGNOSIS — M546 Pain in thoracic spine: Secondary | ICD-10-CM | POA: Diagnosis not present

## 2023-01-20 DIAGNOSIS — Z79891 Long term (current) use of opiate analgesic: Secondary | ICD-10-CM | POA: Diagnosis not present

## 2023-01-20 DIAGNOSIS — G894 Chronic pain syndrome: Secondary | ICD-10-CM | POA: Diagnosis not present

## 2023-01-25 DIAGNOSIS — I10 Essential (primary) hypertension: Secondary | ICD-10-CM | POA: Diagnosis not present

## 2023-01-25 DIAGNOSIS — Z79899 Other long term (current) drug therapy: Secondary | ICD-10-CM | POA: Diagnosis not present

## 2023-01-31 ENCOUNTER — Encounter: Payer: Self-pay | Admitting: Adult Health

## 2023-01-31 ENCOUNTER — Other Ambulatory Visit (HOSPITAL_COMMUNITY)
Admission: RE | Admit: 2023-01-31 | Discharge: 2023-01-31 | Disposition: A | Discharge status: Home / Self Care | Payer: 59 | Source: Ambulatory Visit | Attending: Adult Health | Admitting: Adult Health

## 2023-01-31 ENCOUNTER — Ambulatory Visit: Payer: 59 | Admitting: Adult Health

## 2023-01-31 VITALS — BP 148/96 | HR 82 | Ht 66.0 in | Wt 245.5 lb

## 2023-01-31 DIAGNOSIS — I10 Essential (primary) hypertension: Secondary | ICD-10-CM | POA: Diagnosis not present

## 2023-01-31 DIAGNOSIS — Z1231 Encounter for screening mammogram for malignant neoplasm of breast: Secondary | ICD-10-CM

## 2023-01-31 DIAGNOSIS — Z1211 Encounter for screening for malignant neoplasm of colon: Secondary | ICD-10-CM

## 2023-01-31 DIAGNOSIS — Z0000 Encounter for general adult medical examination without abnormal findings: Secondary | ICD-10-CM | POA: Insufficient documentation

## 2023-01-31 DIAGNOSIS — Z1151 Encounter for screening for human papillomavirus (HPV): Secondary | ICD-10-CM | POA: Insufficient documentation

## 2023-01-31 DIAGNOSIS — Z3042 Encounter for surveillance of injectable contraceptive: Secondary | ICD-10-CM | POA: Insufficient documentation

## 2023-01-31 DIAGNOSIS — Z1331 Encounter for screening for depression: Secondary | ICD-10-CM

## 2023-01-31 DIAGNOSIS — Z1212 Encounter for screening for malignant neoplasm of rectum: Secondary | ICD-10-CM

## 2023-01-31 DIAGNOSIS — Z01419 Encounter for gynecological examination (general) (routine) without abnormal findings: Secondary | ICD-10-CM | POA: Insufficient documentation

## 2023-01-31 LAB — HEMOCCULT GUIAC POC 1CARD (OFFICE): Fecal Occult Blood, POC: NEGATIVE

## 2023-01-31 MED ORDER — MEDROXYPROGESTERONE ACETATE 150 MG/ML IM SUSY
150.0000 mg | PREFILLED_SYRINGE | Freq: Once | INTRAMUSCULAR | Status: AC
  Administered 2023-01-31: 150 mg via INTRAMUSCULAR

## 2023-01-31 NOTE — Progress Notes (Signed)
Patient ID: Brooke [name], female   DOB: Jan 08, 1975, 48 y.o.   MRN: [id number] History of Present Illness: Brooke [name] is a 48 year old white female, single, G3P3003 in for a well woman gyn exam and pap.and get depo.   PCP is Dr Durwin Nora    Current Medications, Allergies, Past Medical History, Past Surgical History, Family History and Social History were reviewed in [employer] electronic medical record.     Review of Systems: Patient denies any headaches, hearing loss, fatigue, blurred vision, shortness of breath, chest pain, abdominal pain, problems with bowel movements, urination, or intercourse. No joint pain or mood swings.  Happy with depo   Physical Exam:BP (!) 148/96 (BP Location: Right Arm, Patient Position: Sitting, Cuff Size: Normal)   Pulse 82   Ht 5\' 6"  (1.676 m)   Wt 245 lb 8 oz (111.4 kg)   BMI 39.62 kg/m   General:  Well developed, well nourished, no acute distress Skin:  Warm and dry Neck:  Midline trachea, normal thyroid, good ROM, no lymphadenopathy Lungs; Clear to auscultation bilaterally Breast:  No dominant palpable mass, retraction, or nipple discharge Cardiovascular: Regular rate and rhythm Abdomen:  Soft, non tender, no hepatosplenomegaly Pelvic:  External genitalia is normal in appearance, no lesions.  The vagina is normal in appearance. Urethra has no lesions or masses. The cervix is bulbous, pap with HR HPV genotyping performed.  Uterus is felt to be normal size, shape, and contour.  No adnexal masses or tenderness noted.Bladder is non tender, no masses felt. Rectal: Good sphincter tone, no polyps, or hemorrhoids felt.  Hemoccult negative. Extremities/musculoskeletal:  No swelling or varicosities noted, no clubbing or cyanosis Psych:  No mood changes, alert and cooperative,seems happy AA is 0 Fall risk is low      01/31/2023    9:39 AM 01/13/2023    8:04 AM 06/08/2022    9:28 AM  Depression screen PHQ 2/9  Decreased Interest 0 0 1  Down, Depressed,  Hopeless 0 0 0  PHQ - 2 Score 0 0 1  Altered sleeping 2 0 2  Tired, decreased energy 2 0 2  Change in appetite 0 0 1  Feeling bad or failure about yourself  0 0 0  Trouble concentrating 0 0 0  Moving slowly or fidgety/restless 0 0 0  Suicidal thoughts 0 0 0  PHQ-9 Score 4 0 6  Difficult doing work/chores  Not difficult at all    On mdeds    01/31/2023    9:40 AM 01/13/2023    8:05 AM 06/08/2022    9:28 AM 03/25/2022    8:44 AM  GAD 7 : Generalized Anxiety Score  Nervous, Anxious, on Edge 0 0 1 0  Control/stop worrying 0 0 1 1  Worry too much - different things 1 0 1 1  Trouble relaxing 1 0 1 1  Restless 2 0 1 1  Easily annoyed or irritable 1 0 1 1  Afraid - awful might happen 0 0 0 0  Total GAD 7 Score 5 0 6 5  Anxiety Difficulty  Not difficult at all      Upstream - 01/31/23 0953       Pregnancy Intention Screening   Does the patient want to become pregnant in the next year? No    Does the patient's partner want to become pregnant in the next year? No    Would the patient like to discuss contraceptive options today? No      Contraception Wrap  Up   Current Method Hormonal Injection    End Method Hormonal Injection    Contraception Counseling Provided Yes              Examination chaperoned by Malachy Mood LPN  Impression and plan: 1. Routine general medical examination at a health care facility (Primary) Pap sent Physical in 1 year Pap in 3-5 years if normal Labs with PCP - Cytology - PAP( [place])  2. Encounter for surveillance of injectable contraceptive Get depo in office, has refills  - medroxyPROGESTERone Acetate SUSY 150 mg  3. Screening mammogram for breast cancer Mammogram scheduled for her 02/07/24 at 8:15 am at [facility]  - MM 3D SCREENING MAMMOGRAM BILATERAL BREAST; Future  4. Encounter for screening fecal occult blood testing Hemoccult was negative  - POCT occult blood stool  5. Screening for colorectal cancer Referred to [facility] for  colonoscopy  - Ambulatory referral to Gastroenterology  6. Essential hypertension Take BP meds Follow up with PCP  7. Encounter for routine gynecological examination with Papanicolaou smear of cervix Pap sent

## 2023-02-02 LAB — CYTOLOGY - PAP
Adequacy: ABSENT
Comment: NEGATIVE
Diagnosis: NEGATIVE
High risk HPV: NEGATIVE

## 2023-02-03 ENCOUNTER — Encounter (INDEPENDENT_AMBULATORY_CARE_PROVIDER_SITE_OTHER): Payer: Self-pay | Admitting: *Deleted

## 2023-02-07 ENCOUNTER — Ambulatory Visit (HOSPITAL_COMMUNITY): Payer: 59

## 2023-02-20 DIAGNOSIS — G894 Chronic pain syndrome: Secondary | ICD-10-CM | POA: Diagnosis not present

## 2023-02-20 DIAGNOSIS — M546 Pain in thoracic spine: Secondary | ICD-10-CM | POA: Diagnosis not present

## 2023-02-20 DIAGNOSIS — M5416 Radiculopathy, lumbar region: Secondary | ICD-10-CM | POA: Diagnosis not present

## 2023-02-20 DIAGNOSIS — Z79891 Long term (current) use of opiate analgesic: Secondary | ICD-10-CM | POA: Diagnosis not present

## 2023-02-21 ENCOUNTER — Encounter (HOSPITAL_BASED_OUTPATIENT_CLINIC_OR_DEPARTMENT_OTHER): Payer: Self-pay | Admitting: Internal Medicine

## 2023-02-21 DIAGNOSIS — R5383 Other fatigue: Secondary | ICD-10-CM

## 2023-03-22 DIAGNOSIS — M546 Pain in thoracic spine: Secondary | ICD-10-CM | POA: Diagnosis not present

## 2023-03-22 DIAGNOSIS — Z79891 Long term (current) use of opiate analgesic: Secondary | ICD-10-CM | POA: Diagnosis not present

## 2023-03-22 DIAGNOSIS — M5416 Radiculopathy, lumbar region: Secondary | ICD-10-CM | POA: Diagnosis not present

## 2023-03-22 DIAGNOSIS — G894 Chronic pain syndrome: Secondary | ICD-10-CM | POA: Diagnosis not present

## 2023-04-08 ENCOUNTER — Ambulatory Visit (HOSPITAL_COMMUNITY): Payer: 59

## 2023-04-22 DIAGNOSIS — M5416 Radiculopathy, lumbar region: Secondary | ICD-10-CM | POA: Diagnosis not present

## 2023-04-22 DIAGNOSIS — M546 Pain in thoracic spine: Secondary | ICD-10-CM | POA: Diagnosis not present

## 2023-04-22 DIAGNOSIS — Z79891 Long term (current) use of opiate analgesic: Secondary | ICD-10-CM | POA: Diagnosis not present

## 2023-04-22 DIAGNOSIS — G894 Chronic pain syndrome: Secondary | ICD-10-CM | POA: Diagnosis not present

## 2023-04-25 ENCOUNTER — Ambulatory Visit: Payer: 59

## 2023-05-06 DIAGNOSIS — K047 Periapical abscess without sinus: Secondary | ICD-10-CM | POA: Diagnosis not present

## 2023-05-10 ENCOUNTER — Other Ambulatory Visit (HOSPITAL_COMMUNITY): Payer: Self-pay | Admitting: Internal Medicine

## 2023-05-10 DIAGNOSIS — E785 Hyperlipidemia, unspecified: Secondary | ICD-10-CM | POA: Diagnosis not present

## 2023-05-10 DIAGNOSIS — H6090 Unspecified otitis externa, unspecified ear: Secondary | ICD-10-CM | POA: Diagnosis not present

## 2023-05-10 DIAGNOSIS — K029 Dental caries, unspecified: Secondary | ICD-10-CM | POA: Diagnosis not present

## 2023-05-10 DIAGNOSIS — G4700 Insomnia, unspecified: Secondary | ICD-10-CM | POA: Diagnosis not present

## 2023-05-10 DIAGNOSIS — F17200 Nicotine dependence, unspecified, uncomplicated: Secondary | ICD-10-CM | POA: Diagnosis not present

## 2023-05-10 DIAGNOSIS — N200 Calculus of kidney: Secondary | ICD-10-CM | POA: Diagnosis not present

## 2023-05-10 DIAGNOSIS — K219 Gastro-esophageal reflux disease without esophagitis: Secondary | ICD-10-CM | POA: Diagnosis not present

## 2023-05-10 DIAGNOSIS — I10 Essential (primary) hypertension: Secondary | ICD-10-CM | POA: Diagnosis not present

## 2023-05-10 DIAGNOSIS — Z1231 Encounter for screening mammogram for malignant neoplasm of breast: Secondary | ICD-10-CM

## 2023-05-16 ENCOUNTER — Other Ambulatory Visit: Payer: Self-pay | Admitting: Internal Medicine

## 2023-05-16 DIAGNOSIS — F320 Major depressive disorder, single episode, mild: Secondary | ICD-10-CM

## 2023-05-22 DIAGNOSIS — M546 Pain in thoracic spine: Secondary | ICD-10-CM | POA: Diagnosis not present

## 2023-05-22 DIAGNOSIS — Z79891 Long term (current) use of opiate analgesic: Secondary | ICD-10-CM | POA: Diagnosis not present

## 2023-05-22 DIAGNOSIS — G894 Chronic pain syndrome: Secondary | ICD-10-CM | POA: Diagnosis not present

## 2023-05-22 DIAGNOSIS — M5416 Radiculopathy, lumbar region: Secondary | ICD-10-CM | POA: Diagnosis not present

## 2023-06-14 ENCOUNTER — Telehealth: Payer: Self-pay | Admitting: Pharmacist

## 2023-06-14 NOTE — Telephone Encounter (Signed)
   This patient is appearing on a report for being at risk of failing the adherence measure for diabetes and hypertension (ACEi/ARB) medications this calendar year.   Medication: lisinopril & atorvastatin Last fill date: 05/16/23 for 30 day supply  Insurance report was not up to date. No action needed at this time.  Also, patient appears to be seeing MD/PCP in [place], [place] (Dr. France Ina)  Marvell Slider, PharmD, BCACP, CPP Clinical Pharmacist, [facility]

## 2023-06-16 DIAGNOSIS — N200 Calculus of kidney: Secondary | ICD-10-CM | POA: Diagnosis not present

## 2023-06-22 DIAGNOSIS — Z79891 Long term (current) use of opiate analgesic: Secondary | ICD-10-CM | POA: Diagnosis not present

## 2023-06-22 DIAGNOSIS — M5416 Radiculopathy, lumbar region: Secondary | ICD-10-CM | POA: Diagnosis not present

## 2023-06-22 DIAGNOSIS — M546 Pain in thoracic spine: Secondary | ICD-10-CM | POA: Diagnosis not present

## 2023-06-22 DIAGNOSIS — G894 Chronic pain syndrome: Secondary | ICD-10-CM | POA: Diagnosis not present

## 2023-07-22 DIAGNOSIS — M5416 Radiculopathy, lumbar region: Secondary | ICD-10-CM | POA: Diagnosis not present

## 2023-07-22 DIAGNOSIS — M546 Pain in thoracic spine: Secondary | ICD-10-CM | POA: Diagnosis not present

## 2023-07-22 DIAGNOSIS — Z79891 Long term (current) use of opiate analgesic: Secondary | ICD-10-CM | POA: Diagnosis not present

## 2023-07-22 DIAGNOSIS — G894 Chronic pain syndrome: Secondary | ICD-10-CM | POA: Diagnosis not present

## 2023-08-03 ENCOUNTER — Encounter (INDEPENDENT_AMBULATORY_CARE_PROVIDER_SITE_OTHER): Payer: Self-pay | Admitting: *Deleted

## 2023-08-09 NOTE — Procedures (Signed)
 SABRA

## 2023-08-10 DIAGNOSIS — E559 Vitamin D deficiency, unspecified: Secondary | ICD-10-CM | POA: Diagnosis not present

## 2023-08-10 DIAGNOSIS — M179 Osteoarthritis of knee, unspecified: Secondary | ICD-10-CM | POA: Diagnosis not present

## 2023-08-10 DIAGNOSIS — E538 Deficiency of other specified B group vitamins: Secondary | ICD-10-CM | POA: Diagnosis not present

## 2023-08-10 DIAGNOSIS — I10 Essential (primary) hypertension: Secondary | ICD-10-CM | POA: Diagnosis not present

## 2023-08-10 DIAGNOSIS — N182 Chronic kidney disease, stage 2 (mild): Secondary | ICD-10-CM | POA: Diagnosis not present

## 2023-08-26 ENCOUNTER — Encounter: Payer: Self-pay | Admitting: Radiology

## 2023-09-17 ENCOUNTER — Other Ambulatory Visit: Payer: Self-pay

## 2023-09-17 ENCOUNTER — Encounter (HOSPITAL_COMMUNITY): Payer: Self-pay

## 2023-09-17 ENCOUNTER — Emergency Department (HOSPITAL_COMMUNITY)
Admission: EM | Admit: 2023-09-17 | Discharge: 2023-09-17 | Disposition: A | Discharge status: Home / Self Care | Attending: Emergency Medicine | Admitting: Emergency Medicine

## 2023-09-17 DIAGNOSIS — K047 Periapical abscess without sinus: Secondary | ICD-10-CM | POA: Diagnosis not present

## 2023-09-17 DIAGNOSIS — R220 Localized swelling, mass and lump, head: Secondary | ICD-10-CM | POA: Diagnosis present

## 2023-09-17 MED ORDER — HYDROCODONE-ACETAMINOPHEN 5-325 MG PO TABS: 1.0000 | ORAL_TABLET | Freq: Four times a day (QID) | ORAL | 0 refills | Status: AC | PRN

## 2023-09-17 MED ORDER — AMOXICILLIN 250 MG PO CAPS
500.0000 mg | ORAL_CAPSULE | Freq: Once | ORAL | Status: AC
  Administered 2023-09-17: 500 mg via ORAL
  Filled 2023-09-17: qty 2

## 2023-09-17 MED ORDER — AMOXICILLIN 500 MG PO CAPS: 500.0000 mg | ORAL_CAPSULE | Freq: Three times a day (TID) | ORAL | 0 refills | Status: AC

## 2023-09-17 MED ORDER — HYDROCODONE-ACETAMINOPHEN 5-325 MG PO TABS
1.0000 | ORAL_TABLET | Freq: Once | ORAL | Status: AC
Start: 2023-09-17 — End: 2023-09-17
  Administered 2023-09-17: 1 via ORAL
  Filled 2023-09-17: qty 1

## 2023-09-17 NOTE — Discharge Instructions (Signed)
Complete your entire course of antibiotics as prescribed.  You  may use the hydrocodone for pain relief but do not drive within 4 hours of taking as this will make you drowsy.  Avoid applying heat or ice to this abscess area which can worsen your symptoms.  You may use warm salt water swish and spit treatment or half peroxide and water swish and spit after meals to keep this area clean as discussed.  Call the dentist listed above for further management of your symptoms.  

## 2023-09-17 NOTE — ED Triage Notes (Signed)
 Pt stated that she started having left ear pain yesterday and used some ear drops and when she woke up this morning, her left side lower jaw and teeth were swollen and painful

## 2023-09-18 DIAGNOSIS — I10 Essential (primary) hypertension: Secondary | ICD-10-CM | POA: Diagnosis not present

## 2023-09-18 DIAGNOSIS — K122 Cellulitis and abscess of mouth: Secondary | ICD-10-CM | POA: Diagnosis not present

## 2023-09-20 NOTE — ED Provider Notes (Signed)
 [place] EMERGENCY DEPARTMENT AT [facility] Provider Note   CSN: [id number] Arrival date & time: 09/17/23  1741     Patient presents with: Facial Swelling   Brooke [name] is a 48 y.o. female With no pertinent past medical history presenting for evaluation of left-sided facial pain and swelling.  Her symptoms started yesterday which she perceived as left ear pain, but then woke this morning with swelling along her left lower jaw and has noted increased pain along her lower molar teeth.  Patient is partially edentulous, she wears upper full denture in the lower bridge, having had most of her teeth extracted by Dr. Sheryle years ago.  She is currently having pain along her left lower lateral incisor and first premolar tooth which is the anchor for her partial.  She denies fevers or chills, no difficulty swallowing.  Tylenol  has not relieved pain.   The history is provided by the patient.       Prior to Admission medications   Medication Sig Start Date End Date Taking? Authorizing Provider  amoxicillin  (AMOXIL ) 500 MG capsule Take 1 capsule (500 mg total) by mouth 3 (three) times daily. 09/17/23  Yes Hailley Byers, PA-C  HYDROcodone -acetaminophen  (NORCO/VICODIN) 5-325 MG tablet Take 1 tablet by mouth every 6 (six) hours as needed. 09/17/23  Yes Marcus Schwandt, PA-C  Acetaminophen  (TYLENOL  PO) Take by mouth.    [provider]  Ascorbic Acid (VITAMIN C) 1000 MG tablet Take 1,000 mg by mouth daily.    [provider]  atorvastatin (LIPITOR) 20 MG tablet TAKE 1 TABLET DAILY FOR HIGH CHOLESTEROL. 03/01/22   Melvenia Manus BRAVO, MD  Cholecalciferol (VITAMIN D3) 50 MCG (2000 UT) capsule Take 2,000 Units by mouth daily.    [provider]  CRANBERRY PO Take 15,000 mg by mouth daily. Patient not taking: Reported on 01/31/2023    [provider]  DULoxetine  (CYMBALTA ) 60 MG capsule take 1 capsule by mouth twice daily. 05/16/23   Melvenia Manus BRAVO, MD   esomeprazole  (NEXIUM ) 40 MG capsule TAKE 1 CAPSULE DAILY AT 12 NOON. 03/29/22   Dixon, Phillip E, MD  ipratropium (ATROVENT ) 0.06 % nasal spray Place 2 sprays into both nostrils 2 (two) times daily. 11/04/22   Melvenia Manus BRAVO, MD  linaclotide  (LINZESS ) 145 MCG CAPS capsule take one a day as needed 08/25/22   Dixon, Phillip E, MD  lisinopril (ZESTRIL) 5 MG tablet take 1 tablet every day blood pressure 12/23/22   Dixon, Phillip E, MD  medroxyPROGESTERone  Acetate 150 MG/ML SUSY INJECT 1 ML IM EVERY 84 DAYS. 11/01/22   Signa Delon LABOR, NP  Menaquinone-7 (VITAMIN K2) 100 MCG CAPS Take 100 mcg by mouth daily.    [provider]  mometasone  (NASONEX ) 50 MCG/ACT nasal spray Place 2 sprays into the nose daily. 11/04/22   Melvenia Manus BRAVO, MD  Omega-3 Fatty Acids (FISH OIL) 1000 MG CAPS Take 1,000 mg by mouth daily.    [provider]  ondansetron  (ZOFRAN ) 4 MG tablet Take 1 tablet (4 mg total) by mouth every 8 (eight) hours as needed for nausea or vomiting. 07/01/22   Mavis Anes, MD  polycarbophil (FIBERCON) 625 MG tablet Take 625 mg by mouth daily.    [provider]  Probiotic Product (PROBIOTIC PO) Take 1 capsule by mouth daily.    [provider]  pyridOXINE (VITAMIN B6) 100 MG tablet Take 100 mg by mouth daily.    [provider]  SUMAtriptan  (IMITREX ) 100 MG tablet  Take 1 tablet (100 mg total) by mouth every 2 (two) hours as needed for migraine. May repeat in 2 hours if headache persists or recurs. 09/30/16   Rolinda Millman, MD  vitamin B-12 (CYANOCOBALAMIN ) 500 MCG tablet Take 1,000 mcg by mouth daily.    [provider]  ZTLIDO  1.8 % PTCH Apply 2 patches topically daily. 06/07/22   [provider]    Allergies: Ciprofloxacin, Other, Sulfa antibiotics, and Zithromax [azithromycin]    Review of Systems  Constitutional:  Negative for fever.  HENT:  Positive for dental problem. Negative for facial swelling and sore throat.    Respiratory:  Negative for shortness of breath.   Musculoskeletal:  Negative for neck pain and neck stiffness.  All other systems reviewed and are negative.   Updated Vital Signs BP 129/86   Pulse 97   Temp 97.9 F (36.6 C)   Resp 18   Ht 5' 6 (1.676 m)   Wt 107.5 kg   SpO2 99%   BMI 38.25 kg/m   Physical Exam Constitutional:      General: She is not in acute distress.    Appearance: She is well-developed.  HENT:     Head: Normocephalic and atraumatic.     Jaw: Tenderness and swelling present. No trismus.     Right Ear: Tympanic membrane and external ear normal.     Left Ear: Tympanic membrane and external ear normal.     Mouth/Throat:     Mouth: No oral lesions.     Dentition: Dental tenderness and gingival swelling present.     Pharynx: Oropharynx is clear.     Comments: Patient is edentulous on top, mostly edentulous on bottom except for bilateral premolars and lateral incisors.  Her left lower dentition is surrounded by edematous gingiva, erythema without fluctuance or active drainage.  Tender to palpation, no cheek induration or erythema. Eyes:     Conjunctiva/sclera: Conjunctivae normal.  Cardiovascular:     Rate and Rhythm: Normal rate.     Heart sounds: Normal heart sounds.  Pulmonary:     Effort: Pulmonary effort is normal.  Abdominal:     General: There is no distension.  Musculoskeletal:        General: Normal range of motion.     Cervical back: Normal range of motion and neck supple.  Lymphadenopathy:     Cervical: No cervical adenopathy.  Skin:    General: Skin is warm and dry.     Findings: No erythema.  Neurological:     Mental Status: She is alert and oriented to person, place, and time.     (all labs ordered are listed, but only abnormal results are displayed) Labs Reviewed - No data to display  EKG: None  Radiology: No results found.   Procedures   Medications Ordered in the ED  amoxicillin  (AMOXIL ) capsule 500 mg (500 mg Oral  Given 09/17/23 1959)  HYDROcodone -acetaminophen  (NORCO/VICODIN) 5-325 MG per tablet 1 tablet (1 tablet Oral Given 09/17/23 1959)                                    Medical Decision Making Patient presenting with gingivitis/dental abscess with some degree of decay and remaining teeth.  She has no trismus, there is no sublingual induration, no sign of Ludwig's.  She has started on amoxicillin , she was given a few hydrocodone  tablets for pain relief for the next several days  after which she can switch back to her Tylenol  or anti-inflammatories.  She is planning to reach out to Dr. Sheryle for office visit as well.  Risk Prescription drug management.        Final diagnoses:  Dental abscess    ED Discharge Orders          Ordered    amoxicillin  (AMOXIL ) 500 MG capsule  3 times daily        09/17/23 1951    HYDROcodone -acetaminophen  (NORCO/VICODIN) 5-325 MG tablet  Every 6 hours PRN        09/17/23 1951               Briele Lagasse, PA-C 09/20/23 1345    Suzette Pac, MD 09/21/23 [phone]

## 2023-11-07 ENCOUNTER — Encounter: Payer: Self-pay | Admitting: Radiology

## 2023-12-12 ENCOUNTER — Other Ambulatory Visit: Payer: Self-pay | Admitting: Internal Medicine

## 2023-12-12 DIAGNOSIS — B9689 Other specified bacterial agents as the cause of diseases classified elsewhere: Secondary | ICD-10-CM
